# Patient Record
Sex: Female | Born: 1980 | Hispanic: No | Marital: Single | Smoking: Never smoker
Health system: Southern US, Community
[De-identification: ages and names within clinical notes are randomized; demographics above are authoritative.]

## PROBLEM LIST (undated history)

## (undated) DIAGNOSIS — K219 Gastro-esophageal reflux disease without esophagitis: Secondary | ICD-10-CM

## (undated) DIAGNOSIS — G43909 Migraine, unspecified, not intractable, without status migrainosus: Secondary | ICD-10-CM

## (undated) DIAGNOSIS — D649 Anemia, unspecified: Secondary | ICD-10-CM

## (undated) DIAGNOSIS — I10 Essential (primary) hypertension: Secondary | ICD-10-CM

## (undated) DIAGNOSIS — O009 Unspecified ectopic pregnancy without intrauterine pregnancy: Secondary | ICD-10-CM

## (undated) DIAGNOSIS — Z5189 Encounter for other specified aftercare: Secondary | ICD-10-CM

## (undated) HISTORY — DX: Encounter for other specified aftercare: Z51.89

## (undated) HISTORY — PX: DILATION AND CURETTAGE OF UTERUS: SHX78

## (undated) HISTORY — DX: Essential (primary) hypertension: I10

---

## 1999-07-18 HISTORY — PX: TUBAL LIGATION: SHX77

## 2003-04-01 ENCOUNTER — Other Ambulatory Visit: Admission: RE | Admit: 2003-04-01 | Discharge: 2003-04-01 | Payer: Self-pay | Admitting: Obstetrics & Gynecology

## 2003-04-21 ENCOUNTER — Ambulatory Visit (HOSPITAL_COMMUNITY): Admission: RE | Admit: 2003-04-21 | Discharge: 2003-04-21 | Payer: Self-pay | Admitting: Obstetrics & Gynecology

## 2005-01-04 ENCOUNTER — Ambulatory Visit: Payer: Self-pay | Admitting: Family Medicine

## 2005-01-10 ENCOUNTER — Ambulatory Visit: Payer: Self-pay | Admitting: Family Medicine

## 2005-01-11 ENCOUNTER — Ambulatory Visit (HOSPITAL_COMMUNITY): Admission: RE | Admit: 2005-01-11 | Discharge: 2005-01-11 | Payer: Self-pay | Admitting: Family Medicine

## 2005-01-19 ENCOUNTER — Ambulatory Visit: Payer: Self-pay | Admitting: Family Medicine

## 2005-02-03 ENCOUNTER — Ambulatory Visit: Payer: Self-pay | Admitting: Family Medicine

## 2005-02-17 ENCOUNTER — Ambulatory Visit: Payer: Self-pay | Admitting: Family Medicine

## 2005-04-19 ENCOUNTER — Ambulatory Visit: Payer: Self-pay | Admitting: Family Medicine

## 2005-05-19 ENCOUNTER — Ambulatory Visit: Payer: Self-pay | Admitting: Family Medicine

## 2006-05-28 ENCOUNTER — Emergency Department (HOSPITAL_COMMUNITY): Admission: EM | Admit: 2006-05-28 | Discharge: 2006-05-28 | Payer: Self-pay | Admitting: Emergency Medicine

## 2006-09-11 ENCOUNTER — Ambulatory Visit: Payer: Self-pay | Admitting: Family Medicine

## 2006-09-25 ENCOUNTER — Ambulatory Visit: Payer: Self-pay | Admitting: Family Medicine

## 2006-12-17 ENCOUNTER — Ambulatory Visit: Payer: Self-pay | Admitting: Family Medicine

## 2007-03-13 ENCOUNTER — Ambulatory Visit (HOSPITAL_COMMUNITY): Admission: RE | Admit: 2007-03-13 | Discharge: 2007-03-13 | Payer: Self-pay | Admitting: Obstetrics & Gynecology

## 2008-04-12 ENCOUNTER — Emergency Department (HOSPITAL_COMMUNITY): Admission: EM | Admit: 2008-04-12 | Discharge: 2008-04-12 | Payer: Self-pay | Admitting: Emergency Medicine

## 2008-05-08 ENCOUNTER — Emergency Department (HOSPITAL_COMMUNITY): Admission: EM | Admit: 2008-05-08 | Discharge: 2008-05-08 | Payer: Self-pay | Admitting: Emergency Medicine

## 2009-12-22 ENCOUNTER — Emergency Department (HOSPITAL_COMMUNITY): Admission: EM | Admit: 2009-12-22 | Discharge: 2009-12-22 | Payer: Self-pay | Admitting: Emergency Medicine

## 2010-02-22 ENCOUNTER — Other Ambulatory Visit: Admission: RE | Admit: 2010-02-22 | Discharge: 2010-02-22 | Payer: Self-pay | Admitting: Obstetrics & Gynecology

## 2010-11-29 NOTE — Op Note (Signed)
Jaclyn Thornton, SIGNOR                ACCOUNT NO.:  [id number]   MEDICAL RECORD NO.:  [id number]          PATIENT TYPE:  AMB   LOCATION:  DAY                           FACILITY:  [facility]   PHYSICIAN:  Lazaro Arms, M.D.   DATE OF BIRTH:  Apr 17, 1981   DATE OF PROCEDURE:  03/13/2007  DATE OF DISCHARGE:                               OPERATIVE REPORT   PREOPERATIVE DIAGNOSIS:  Desired sterilization.   POSTOPERATIVE DIAGNOSIS:  Desired sterilization.   PROCEDURE:  Laparoscopic tubal ligation.   SURGEON:  Lazaro Arms, M.D.   ANESTHESIA:  General endotracheal anesthesia.   FINDINGS:  The patient had a normal uterus, tubes and ovaries.  There  were no abnormalities of the pelvis noted.   DESCRIPTION OF PROCEDURE:  The patient was taken to the operating room  and placed in the supine position and underwent general endotracheal  anesthesia.  She was placed in the dorsal lithotomy position and was  prepped and draped in the usual sterile fashion.  A Hulka tenaculum was  placed in the uterus for uterine manipulation.  An incision was made in  the umbilicus.  A Veress needle was used.  The peritoneal cavity was  insufflated.  A non-bladed 11 mm trocar was used with the video  laparoscope and direct peritoneal placement was viewed under direct  visualization.  The fallopian tubes were identified bilaterally.  The  patient was placed in the Trendelenburg position.  They were burned, no  resistance beyond the distal isthmic ampullary region of the tube, an  approximately 2.5 cm segment bilaterally.  There was good hemostasis.  The gas was allowed to escape.  The instruments were removed from the  vagina.  The fascia was closed using #0 Vicryl.  The subcutaneous tissue  was closed using #0 Vicryl as well and the skin was closed using skin  staples.   The patient tolerated the procedure well.  She experienced minimal blood  loss.  She was taken to the recovery room in good stable condition.   All  counts were correct x3.      Lazaro Arms, M.D.  Electronically Signed     LHE/MEDQ  D:  03/13/2007  T:  03/13/2007  Job:  [id number]

## 2010-12-02 NOTE — Op Note (Signed)
   NAME:  Jaclyn Thornton, Jaclyn Thornton                          ACCOUNT NO.:  [id number]   MEDICAL RECORD NO.:  [id number]                   PATIENT TYPE:  AMB   LOCATION:  DAY                                  FACILITY:  [facility]   PHYSICIAN:  Lazaro Arms, M.D.                DATE OF BIRTH:  09/24/1980   DATE OF PROCEDURE:  04/21/2003  DATE OF DISCHARGE:  04/21/2003                                 OPERATIVE REPORT   PREOPERATIVE DIAGNOSIS:  Moderate cervical dysplasia with large lesion.   POSTOPERATIVE DIAGNOSIS:  Moderate cervical dysplasia with large lesion.   PROCEDURE:  Laser ablation of the cervix.   SURGEON:  Lazaro Arms, M.D.   ANESTHESIA:  Laryngeal mask airway.   FINDINGS:  The patient was seen in the office after an abnormal Pap smear.  She underwent a colposcopic-directed biopsy and was found to have moderate  cervical dysplasia with biopsy with a relatively large lesion.  As a result,  it was recommended that she undergo laser ablation of the cervix.   DESCRIPTION OF PROCEDURE:  The patient was taken to the operating room and  placed in the supine position where she underwent laryngeal mask airway.  She then was placed in the dorsal lithotomy position and draped in the usual  sterile fashion.   A pericervical block using 0.5% Marcaine with 1:200,000 epinephrine was  placed.  The holmium laser was used, and an ablation of 5 to 7 mm  peripherally to 7 to 9 mm centrally was performed, getting good margin  around all the dysplasia.  The entire transformation zone was treated.   The patient was awakened from anesthesia and taken to the recovery room in  good and stable condition.  There was no blood loss for the procedure.      ___________________________________________                                            Lazaro Arms, M.D.   LHE/MEDQ  D:  05/14/2003  T:  05/14/2003  Job:  [id number]

## 2011-04-28 LAB — COMPREHENSIVE METABOLIC PANEL
ALT: 18
Albumin: 3.7
Alkaline Phosphatase: 50
BUN: 7
GFR calc Af Amer: >60
Sodium: 136

## 2011-04-28 LAB — CBC
HCT: 35.6 — ABNORMAL LOW
MCV: 85.8
Platelets: 304

## 2011-04-28 LAB — URINALYSIS, ROUTINE W REFLEX MICROSCOPIC
Bilirubin Urine: NEGATIVE
Ketones, ur: NEGATIVE
Nitrite: NEGATIVE
Specific Gravity, Urine: 1.02
Urobilinogen, UA: 0.2

## 2011-04-28 LAB — DIFFERENTIAL
Basophils Absolute: 0
Eosinophils Absolute: 0.1
Lymphocytes Relative: 25
Lymphs Abs: 2.1
Monocytes Relative: 6
Neutro Abs: 5.7

## 2011-04-28 LAB — HCG, QUANTITATIVE, PREGNANCY: hCG, Beta Chain, Quant, S: <2

## 2011-06-02 IMAGING — CR DG LUMBAR SPINE COMPLETE 4+V
5 series · 5 of 5 positions shown · non-contrast
Comparison: None.

CLINICAL DATA: History of injury with back pain.  Right hip pain.

LUMBAR SPINE - COMPLETE 4+ VIEW

[view not recorded (1 of 5)]
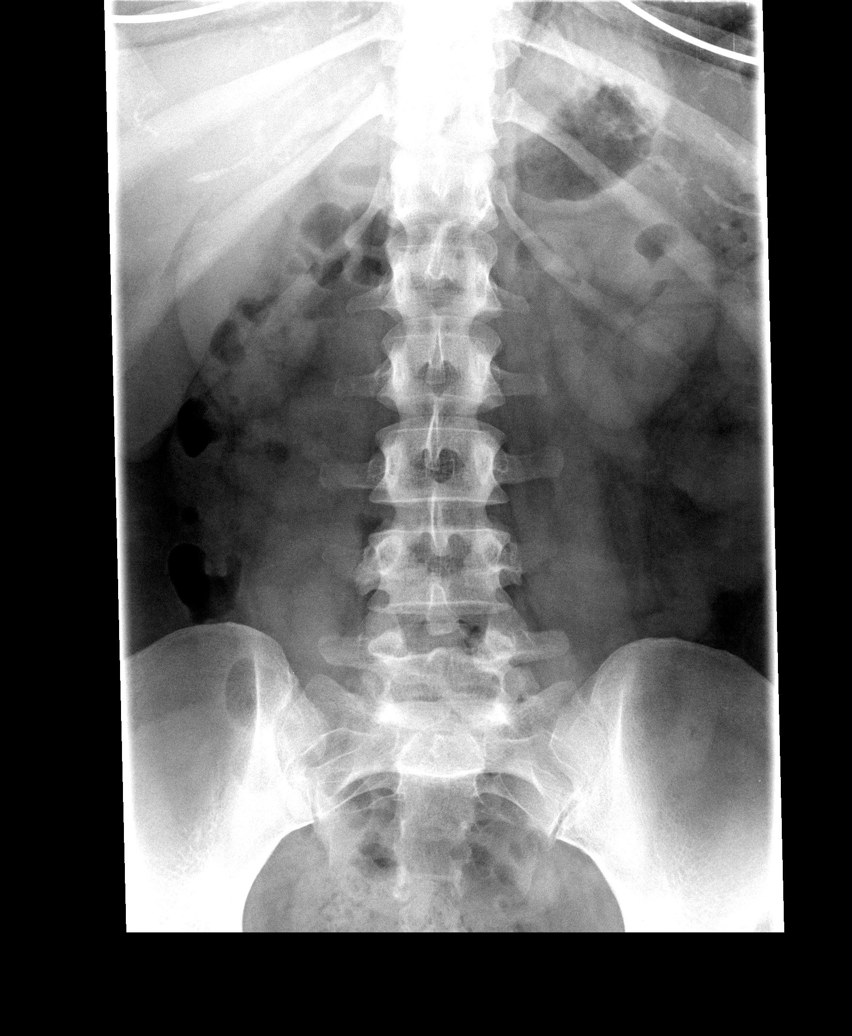

[view not recorded (2 of 5)]
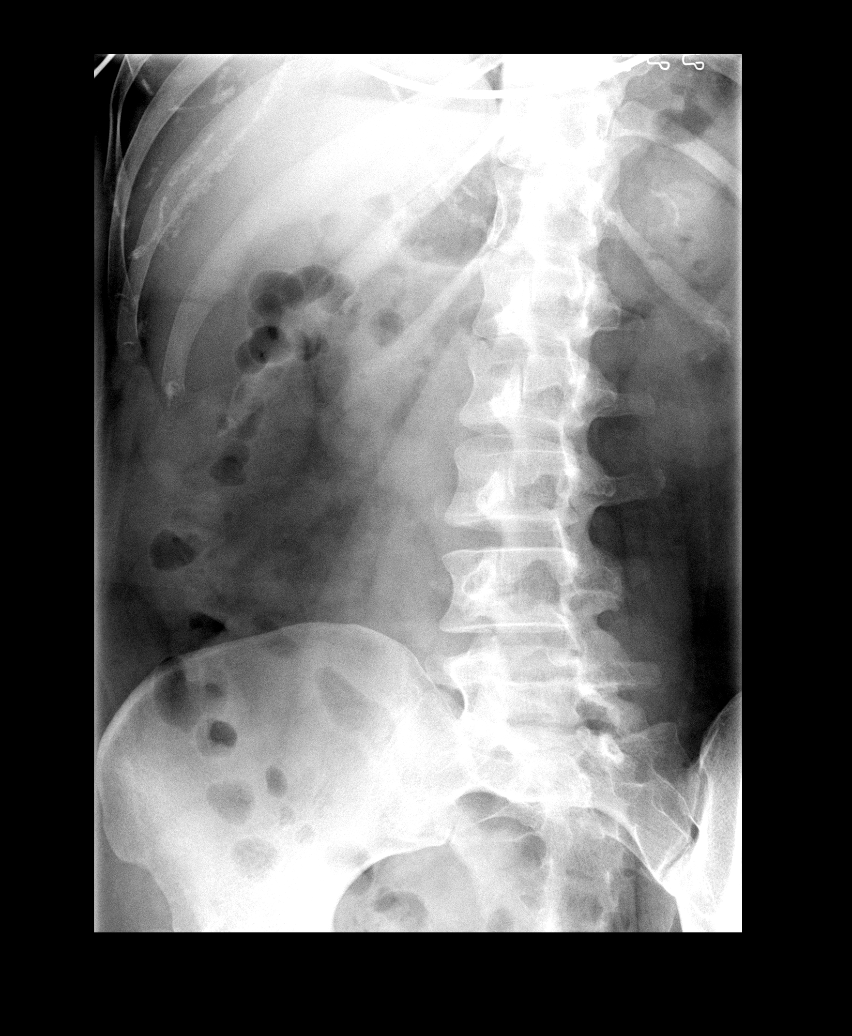

[view not recorded (3 of 5)]
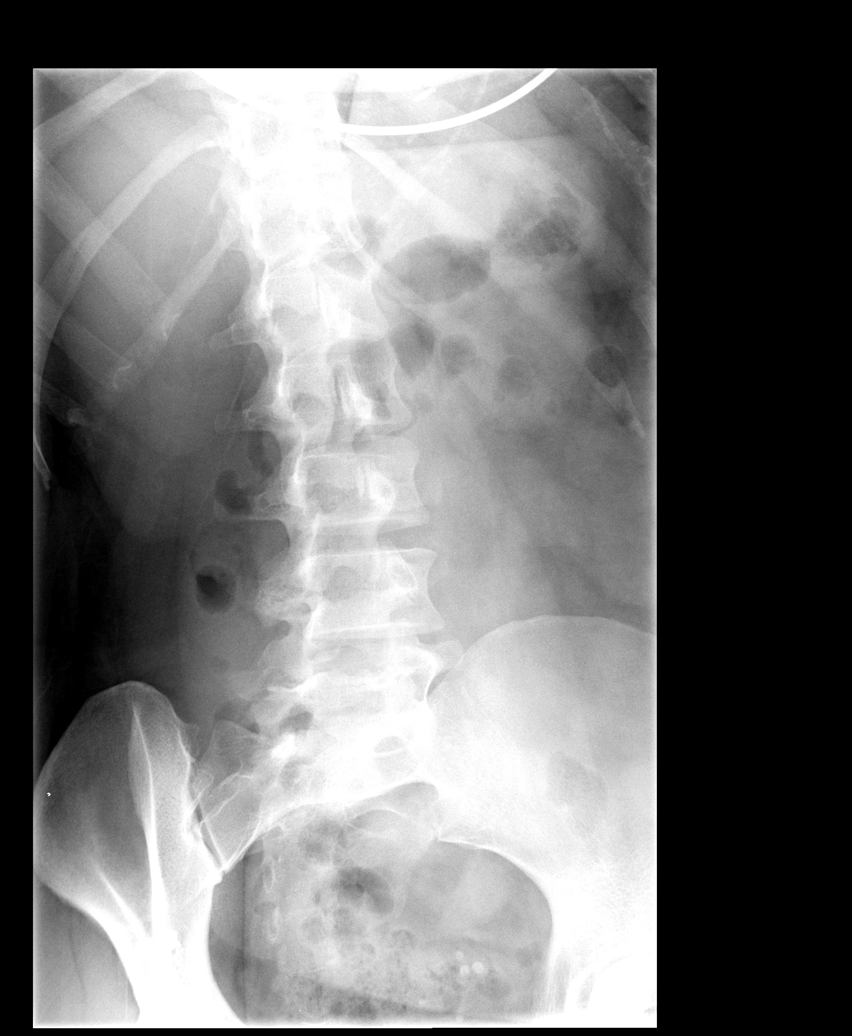

[view not recorded (4 of 5)]
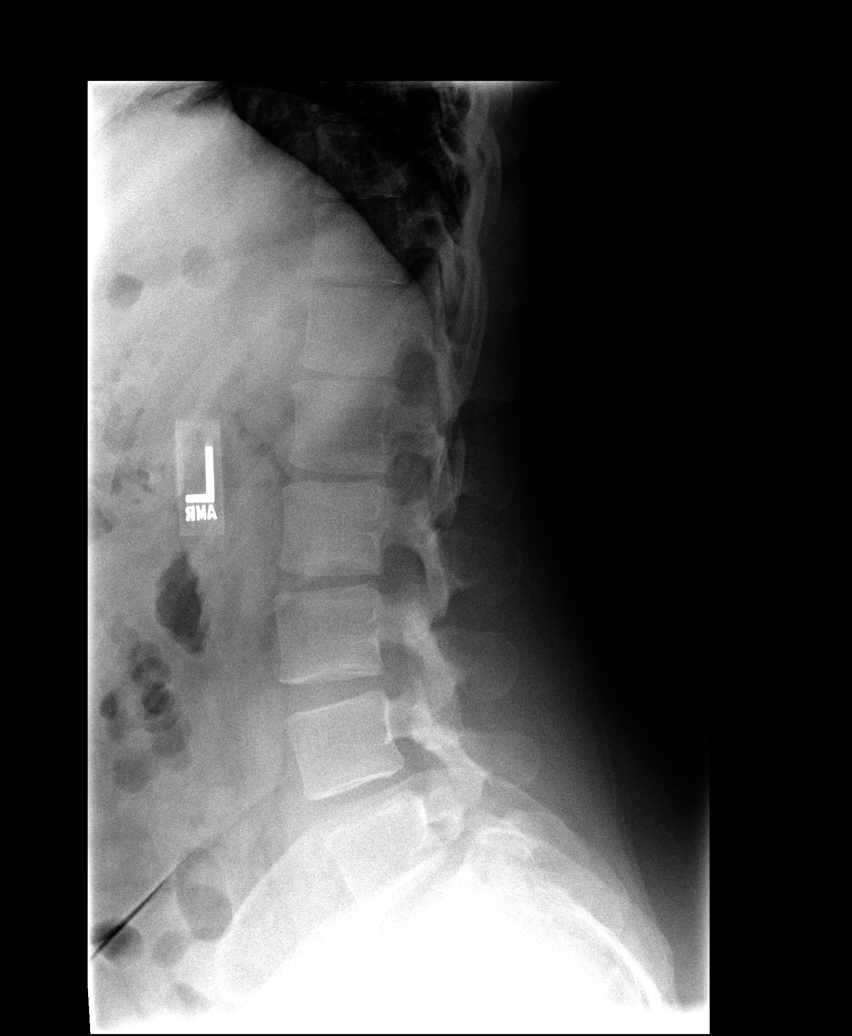

[view not recorded (5 of 5)]
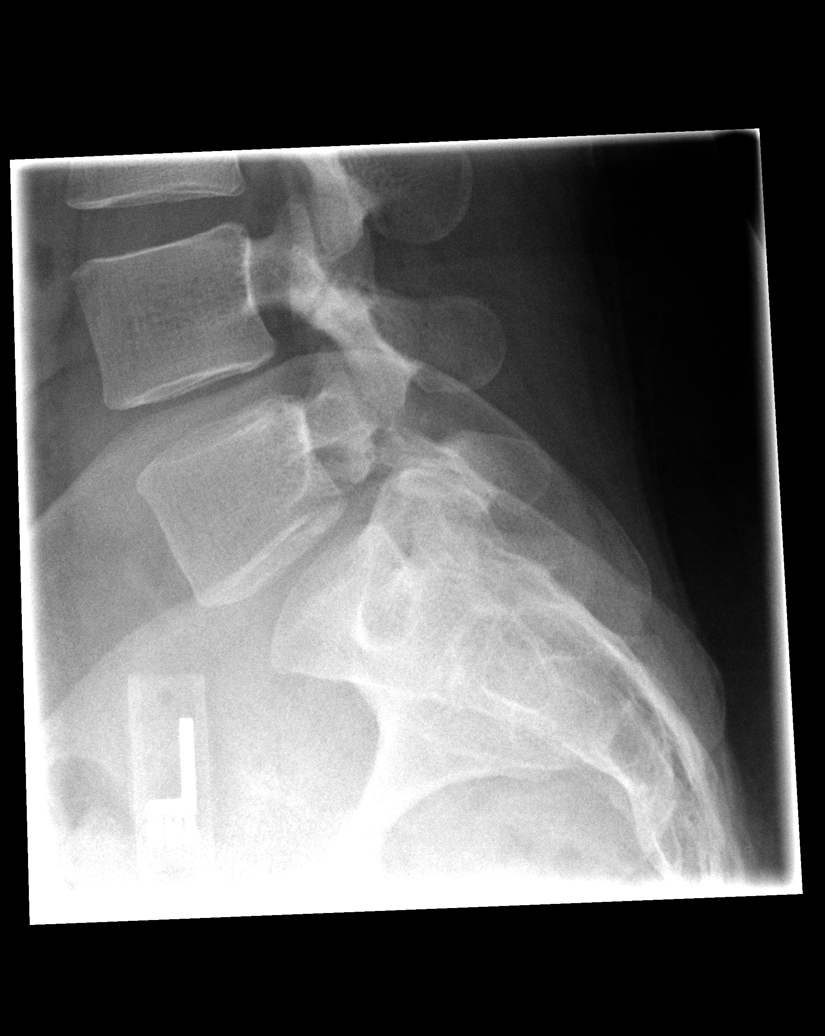

[5 of 5 positions shown; findings below may reference images not displayed]

FINDINGS: SI joints appear intact.  Alignment is normal.
Intervertebral disc spaces are preserved.  No fractures subluxation
is seen.  There is suggestion of possible pars defects on lateral
image oblique views no definite pars defects are seen.  No
significant spondylosis is seen.
IMPRESSION: No fracture or subluxation is evident.

## 2014-07-17 DIAGNOSIS — O009 Unspecified ectopic pregnancy without intrauterine pregnancy: Secondary | ICD-10-CM

## 2014-07-17 HISTORY — DX: Unspecified ectopic pregnancy without intrauterine pregnancy: O00.90

## 2015-07-07 ENCOUNTER — Emergency Department (HOSPITAL_COMMUNITY): Payer: Medicaid Other

## 2015-07-07 ENCOUNTER — Encounter (HOSPITAL_COMMUNITY): Payer: Self-pay

## 2015-07-07 ENCOUNTER — Emergency Department (HOSPITAL_COMMUNITY)
Admission: EM | Admit: 2015-07-07 | Discharge: 2015-07-07 | Disposition: A | Discharge status: Home / Self Care | Payer: Medicaid Other | Attending: Emergency Medicine | Admitting: Emergency Medicine

## 2015-07-07 DIAGNOSIS — O9989 Other specified diseases and conditions complicating pregnancy, childbirth and the puerperium: Secondary | ICD-10-CM | POA: Insufficient documentation

## 2015-07-07 DIAGNOSIS — R0602 Shortness of breath: Secondary | ICD-10-CM | POA: Diagnosis not present

## 2015-07-07 DIAGNOSIS — Z862 Personal history of diseases of the blood and blood-forming organs and certain disorders involving the immune mechanism: Secondary | ICD-10-CM | POA: Insufficient documentation

## 2015-07-07 DIAGNOSIS — R109 Unspecified abdominal pain: Secondary | ICD-10-CM | POA: Diagnosis not present

## 2015-07-07 DIAGNOSIS — R51 Headache: Secondary | ICD-10-CM | POA: Diagnosis not present

## 2015-07-07 DIAGNOSIS — D649 Anemia, unspecified: Secondary | ICD-10-CM

## 2015-07-07 DIAGNOSIS — Z9851 Tubal ligation status: Secondary | ICD-10-CM | POA: Insufficient documentation

## 2015-07-07 DIAGNOSIS — Z3A01 Less than 8 weeks gestation of pregnancy: Secondary | ICD-10-CM | POA: Insufficient documentation

## 2015-07-07 DIAGNOSIS — O200 Threatened abortion: Secondary | ICD-10-CM | POA: Diagnosis not present

## 2015-07-07 DIAGNOSIS — O209 Hemorrhage in early pregnancy, unspecified: Secondary | ICD-10-CM | POA: Diagnosis present

## 2015-07-07 DIAGNOSIS — Z3490 Encounter for supervision of normal pregnancy, unspecified, unspecified trimester: Secondary | ICD-10-CM

## 2015-07-07 DIAGNOSIS — O99511 Diseases of the respiratory system complicating pregnancy, first trimester: Secondary | ICD-10-CM | POA: Diagnosis not present

## 2015-07-07 HISTORY — DX: Migraine, unspecified, not intractable, without status migrainosus: G43.909

## 2015-07-07 LAB — BASIC METABOLIC PANEL
ANION GAP: 7 (ref 5–15)
BUN: 8 mg/dL (ref 6–20)
CALCIUM: 8.8 mg/dL — AB (ref 8.9–10.3)
CO2: 22 mmol/L (ref 22–32)
Chloride: 107 mmol/L (ref 101–111)
Creatinine, Ser: 0.51 mg/dL (ref 0.44–1.00)
Glucose, Bld: 98 mg/dL (ref 65–99)
Potassium: 3.8 mmol/L (ref 3.5–5.1)
SODIUM: 136 mmol/L (ref 135–145)

## 2015-07-07 LAB — URINALYSIS, ROUTINE W REFLEX MICROSCOPIC
Bilirubin Urine: NEGATIVE
GLUCOSE, UA: NEGATIVE mg/dL
Hgb urine dipstick: NEGATIVE
Ketones, ur: NEGATIVE mg/dL
Leukocytes, UA: NEGATIVE
Nitrite: NEGATIVE
PROTEIN: NEGATIVE mg/dL
Specific Gravity, Urine: 1.025 (ref 1.005–1.030)
pH: 6 (ref 5.0–8.0)

## 2015-07-07 LAB — HCG, QUANTITATIVE, PREGNANCY: HCG, BETA CHAIN, QUANT, S: 2025 m[IU]/mL — AB (ref <5)

## 2015-07-07 LAB — CBC WITH DIFFERENTIAL/PLATELET
BASOS ABS: 0 10*3/uL (ref 0.0–0.1)
BASOS PCT: 0 %
EOS ABS: 0.1 10*3/uL (ref 0.0–0.7)
Eosinophils Relative: 1 %
HEMATOCRIT: 22.8 % — AB (ref 36.0–46.0)
HEMOGLOBIN: 6.8 g/dL — AB (ref 12.0–15.0)
Lymphocytes Relative: 19 %
Lymphs Abs: 1.6 10*3/uL (ref 0.7–4.0)
MCH: 23.7 pg — ABNORMAL LOW (ref 26.0–34.0)
MCHC: 29.8 g/dL — ABNORMAL LOW (ref 30.0–36.0)
MCV: 79.4 fL (ref 78.0–100.0)
Monocytes Absolute: 0.7 10*3/uL (ref 0.1–1.0)
Monocytes Relative: 8 %
NEUTROS ABS: 6.3 10*3/uL (ref 1.7–7.7)
NEUTROS PCT: 73 %
Platelets: 508 10*3/uL — ABNORMAL HIGH (ref 150–400)
RBC: 2.87 MIL/uL — AB (ref 3.87–5.11)
RDW: 20.3 % — ABNORMAL HIGH (ref 11.5–15.5)
WBC: 8.6 10*3/uL (ref 4.0–10.5)

## 2015-07-07 LAB — POC URINE PREG, ED: Preg Test, Ur: POSITIVE — AB

## 2015-07-07 LAB — ABO/RH: ABO/RH(D): A POS

## 2015-07-07 LAB — I-STAT BETA HCG BLOOD, ED (MC, WL, AP ONLY): HCG, QUANTITATIVE: 1794.3 m[IU]/mL — AB (ref <5)

## 2015-07-07 LAB — PREPARE RBC (CROSSMATCH)

## 2015-07-07 MED ORDER — SODIUM CHLORIDE 0.9 % IV SOLN: 10.0000 mL/h | Freq: Once | INTRAVENOUS | Status: DC

## 2015-07-07 MED ORDER — SODIUM CHLORIDE 0.9 % IV BOLUS (SEPSIS)
500.0000 mL | Freq: Once | INTRAVENOUS | Status: AC
  Administered 2015-07-07: 500 mL via INTRAVENOUS

## 2015-07-07 MED ORDER — SODIUM CHLORIDE 0.9 % IV SOLN
INTRAVENOUS | Status: DC
  Administered 2015-07-07: 10:00:00 via INTRAVENOUS

## 2015-07-07 NOTE — ED Notes (Signed)
CRITICAL VALUE ALERT  Critical value received:  Hgb 6.8  Date of notification:  07/07/2015  Time of notification: 1030  Critical value read back: Yes  Nurse who received alert:  Merleen Millinerhristina Bently Morath, RN   MD notified (1st page):  MD Deretha EmoryZackowski

## 2015-07-07 NOTE — ED Notes (Signed)
Spoke to [facility], blood bank. Sherry informed nurse that second unit of blood could be withheld and used for another pt and pt will not be charged.  Dr. Deretha EmoryZackowski notified.

## 2015-07-07 NOTE — ED Notes (Signed)
Pt reports left sided abd pain and positive pregnancy test for past few days.  Reports had tubal ligation 10 years ago.

## 2015-07-07 NOTE — ED Provider Notes (Signed)
CSN: [id number]     Arrival date & time 07/07/15  9562 History  By signing my name below, I, Placido Sou, attest that this documentation has been prepared under the direction and in the presence of Vanetta Mulders, MD. Electronically Signed: Placido Sou, ED Scribe. 07/07/2015. 11:03 AM.   Chief Complaint  Patient presents with  . Possible Ectopic Pregnancy    The history is provided by the patient. No language interpreter was used.    HPI Comments: Jaclyn Thornton is a 34 y.o. female with a SHx including tubal ligation 10 years ago who presents to the Emergency Department complaining of constant, mild, left sided abd pain onset a few days ago. Pt notes taking a pregnancy test 3 days ago which came back positive. She describes her pain as cramping. She notes some mild SOB that worsens with exertion and an intermittent, mild, HA. She notes her LNMP was 11/9. She notes a hx of anemia as a child and took an iron supplement for some time. Pt notes having 2 boys who are 12 and 36 years old further noting this would be her third pregnancy. She denies vaginal bleeding, lightheadedness, fever, chills, visual disturbances, cold like symptoms, CP, n/v/d, dysuria, hematuria, bloody stool, leg swelling, rashes, bleeding easily, back pain and LOC.   Past Medical History  Diagnosis Date  . Migraines    Past Surgical History  Procedure Laterality Date  . Tubal ligation     No family history on file. Social History  Substance Use Topics  . Smoking status: Never Smoker   . Smokeless tobacco: None  . Alcohol Use: Yes     Comment: occ   OB History    No data available     Review of Systems  Constitutional: Negative for fever and chills.  HENT: Negative for congestion, postnasal drip, rhinorrhea and sore throat.   Eyes: Negative for visual disturbance.  Respiratory: Positive for shortness of breath. Negative for cough.   Cardiovascular: Negative for chest pain and leg swelling.   Gastrointestinal: Positive for abdominal pain. Negative for nausea, vomiting, diarrhea and blood in stool.  Genitourinary: Negative for dysuria, hematuria and vaginal bleeding.  Musculoskeletal: Negative for back pain.  Skin: Negative for rash.  Neurological: Positive for headaches. Negative for syncope and light-headedness.  Hematological: Does not bruise/bleed easily.  Psychiatric/Behavioral: Negative for confusion.  All other systems reviewed and are negative.  Allergies  Review of patient's allergies indicates no known allergies.  Home Medications   Prior to Admission medications   Medication Sig Start Date End Date Taking? Authorizing Provider  acetaminophen (TYLENOL) 500 MG tablet Take 1,000 mg by mouth every 6 (six) hours as needed for headache.   Yes Historical Provider, MD  Aspirin-Salicylamide-Caffeine (BC HEADACHE POWDER PO) Take 1 packet by mouth daily as needed (headache).   Yes Historical Provider, MD   BP 126/79 mmHg  Pulse 89  Temp(Src) 98.1 F (36.7 C) (Oral)  Resp 22  Ht  (1.676 m)  Wt 84.369 kg  BMI 30.04 kg/m2  SpO2 100%  LMP 05/26/2015 Physical Exam  Constitutional: She is oriented to person, place, and time. She appears well-developed and well-nourished.  HENT:  Head: Normocephalic and atraumatic.  Mouth/Throat: No oropharyngeal exudate.  Eyes: EOM are normal. Pupils are equal, round, and reactive to light. No scleral icterus.  Neck: Normal range of motion. No tracheal deviation present.  Cardiovascular: Normal rate, regular rhythm and normal heart sounds.   No murmur heard. Pulmonary/Chest: Effort normal  and breath sounds normal. No respiratory distress. She has no wheezes. She has no rales. She exhibits no tenderness.  Abdominal: Soft. Bowel sounds are normal. There is no tenderness.  Musculoskeletal: Normal range of motion. She exhibits no edema.  No swelling of bilateral ankles; cap refill less than 1 second  Neurological: She is alert and  oriented to person, place, and time. No cranial nerve deficit. She exhibits normal muscle tone. Coordination normal.  Skin: Skin is warm and dry. She is not diaphoretic.  Psychiatric: She has a normal mood and affect. Her behavior is normal.  Nursing note and vitals reviewed.  ED Course  Procedures  DIAGNOSTIC STUDIES: Oxygen Saturation is 100% on RA, normal by my interpretation.    COORDINATION OF CARE: 10:51 AM Discussed next steps with pt and she agreed to the plan.   Labs Review Labs Reviewed  BASIC METABOLIC PANEL - Abnormal; Notable for the following:    Calcium 8.8 (*)    All other components within normal limits  CBC WITH DIFFERENTIAL/PLATELET - Abnormal; Notable for the following:    RBC 2.87 (*)    Hemoglobin 6.8 (*)    HCT 22.8 (*)    MCH 23.7 (*)    MCHC 29.8 (*)    RDW 20.3 (*)    Platelets 508 (*)    All other components within normal limits  HCG, QUANTITATIVE, PREGNANCY - Abnormal; Notable for the following:    hCG, Beta Chain, Quant, S 2025 (*)    All other components within normal limits  POC URINE PREG, ED - Abnormal; Notable for the following:    Preg Test, Ur POSITIVE (*)    All other components within normal limits  I-STAT BETA HCG BLOOD, ED ([facility], [facility], [facility] ONLY) - Abnormal; Notable for the following:    I-stat hCG, quantitative 1794.3 (*)    All other components within normal limits  URINALYSIS, ROUTINE W REFLEX MICROSCOPIC (NOT AT [facility])  PREPARE RBC (CROSSMATCH)  TYPE AND SCREEN  ABO/RH   Results for orders placed or performed during the hospital encounter of 07/07/15  Urinalysis, Routine w reflex microscopic (not at [facility])  Result Value Ref Range   Color, Urine YELLOW YELLOW   APPearance CLEAR CLEAR   Specific Gravity, Urine 1.025 1.005 - 1.030   pH 6.0 5.0 - 8.0   Glucose, UA NEGATIVE NEGATIVE mg/dL   Hgb urine dipstick NEGATIVE NEGATIVE   Bilirubin Urine NEGATIVE NEGATIVE   Ketones, ur NEGATIVE NEGATIVE mg/dL   Protein, ur NEGATIVE NEGATIVE  mg/dL   Nitrite NEGATIVE NEGATIVE   Leukocytes, UA NEGATIVE NEGATIVE  Basic metabolic panel  Result Value Ref Range   Sodium 136 135 - 145 mmol/L   Potassium 3.8 3.5 - 5.1 mmol/L   Chloride 107 101 - 111 mmol/L   CO2 22 22 - 32 mmol/L   Glucose, Bld 98 65 - 99 mg/dL   BUN 8 6 - 20 mg/dL   Creatinine, Ser 9.60 0.44 - 1.00 mg/dL   Calcium 8.8 (L) 8.9 - 10.3 mg/dL   GFR calc non Af Amer >60 >60 mL/min   GFR calc Af Amer >60 >60 mL/min   Anion gap 7 5 - 15  CBC with Differential/Platelet  Result Value Ref Range   WBC 8.6 4.0 - 10.5 K/uL   RBC 2.87 (L) 3.87 - 5.11 MIL/uL   Hemoglobin 6.8 (LL) 12.0 - 15.0 g/dL   HCT 45.4 (L) 09.8 - 11.9 %   MCV 79.4 78.0 - 100.0 fL  MCH 23.7 (L) 26.0 - 34.0 pg   MCHC 29.8 (L) 30.0 - 36.0 g/dL   RDW 82.920.3 (H) 56.211.5 - 13.015.5 %   Platelets 508 (H) 150 - 400 K/uL   Neutrophils Relative % 73 %   Neutro Abs 6.3 1.7 - 7.7 K/uL   Lymphocytes Relative 19 %   Lymphs Abs 1.6 0.7 - 4.0 K/uL   Monocytes Relative 8 %   Monocytes Absolute 0.7 0.1 - 1.0 K/uL   Eosinophils Relative 1 %   Eosinophils Absolute 0.1 0.0 - 0.7 K/uL   Basophils Relative 0 %   Basophils Absolute 0.0 0.0 - 0.1 K/uL  hCG, quantitative, pregnancy  Result Value Ref Range   hCG, Beta Chain, Quant, S 2025 (H) <5 mIU/mL  POC Urine Pregnancy, ED (do NOT order at [facility])  Result Value Ref Range   Preg Test, Ur POSITIVE (A) NEGATIVE  I-Stat Beta hCG blood, ED ([facility], [facility], [facility] only)  Result Value Ref Range   I-stat hCG, quantitative 1794.3 (H) <5 mIU/mL   Comment 3          Prepare RBC  Result Value Ref Range   Order Confirmation ORDER PROCESSED BY BLOOD BANK   Type and screen [facility]  Result Value Ref Range   ABO/RH(D) A POS    Antibody Screen NEG    Sample Expiration 07/10/2015    Unit Number [id number]    Blood Component Type RED CELLS,LR    Unit division 00    Status of Unit ALLOCATED    Transfusion Status OK TO TRANSFUSE    Crossmatch Result Compatible    Unit Number  [id number]    Blood Component Type RED CELLS,LR    Unit division 00    Status of Unit ISSUED    Transfusion Status OK TO TRANSFUSE    Crossmatch Result Compatible   ABO/Rh  Result Value Ref Range   ABO/RH(D) A POS      Imaging Review [place] Ob Comp Less 14 Wks  07/07/2015  CLINICAL DATA:  Pelvic pain for 1 week EXAM: OBSTETRIC <14 WK US AND TRANSVAGINAL OB US TECHNIQUE: Both transabdominal and transvaginal ultrasound examinations were performed for complete evaluation of the gestation as well as the maternal uterus, adnexal regions, and pelvic cul-de-sac. Transvaginal technique was performed to assess early pregnancy. COMPARISON:  None. FINDINGS: Intrauterine gestational sac: No gestational sac is identified. Maternal uterus/adnexae: The uterus is within normal limits. A complex cystic lesion is noted in the left adnexa. The right ovary is within normal limits. A minimal amount of free pelvic fluid is seen. IMPRESSION: No definitive intrauterine or extrauterine gestational sac is seen. Complex cystic lesion likely representing hemorrhagic cysts in the left ovary. Correlation with serial beta HCG level is recommended. Followup examination is recommended as clinically indicated. Electronically Signed   By: Alcide CleverMark  Lukens M.D.   On: 07/07/2015 11:35   [place] Ob Transvaginal  07/07/2015  CLINICAL DATA:  Pelvic pain for 1 week EXAM: OBSTETRIC <14 WK US AND TRANSVAGINAL OB US TECHNIQUE: Both transabdominal and transvaginal ultrasound examinations were performed for complete evaluation of the gestation as well as the maternal uterus, adnexal regions, and pelvic cul-de-sac. Transvaginal technique was performed to assess early pregnancy. COMPARISON:  None. FINDINGS: Intrauterine gestational sac: No gestational sac is identified. Maternal uterus/adnexae: The uterus is within normal limits. A complex cystic lesion is noted in the left adnexa. The right ovary is within normal limits. A minimal amount of free pelvic  fluid is  seen. IMPRESSION: No definitive intrauterine or extrauterine gestational sac is seen. Complex cystic lesion likely representing hemorrhagic cysts in the left ovary. Correlation with serial beta HCG level is recommended. Followup examination is recommended as clinically indicated. Electronically Signed   By: Alcide Clever M.D.   On: 07/07/2015 11:35   I have personally reviewed and evaluated these lab results as part of my medical decision-making.   EKG Interpretation None      MDM   Final diagnoses:  Pregnancy  Threatened miscarriage  Anemia, unspecified anemia type   Patient with positive pregnancy test. Patient's had tubal ligation. Last measured. Was November 9. This would be gravida 3 para 2 for her. Initial concern due to the pelvic crampy pain and the Thornton hemoglobin was that she had a ruptured ectopic in with bleeding in her abdomen. However ultrasound shows no evidence of significant fluid. Does show evidence of a cystic complex lesion in the left adnexa. Her quantitative hCG was 2025. Patient started to receive one unit of blood transfusion out of concern for intra-abdominal hemorrhage. She went on to receive that. Patient's anemia is probably chronic. Patient was told she had anemia in the past but hasn't followed up. Patient did received one unit. The second unit of blood was canceled. Discussed with OB/GYN doctor Eure, and he will follow her up in the office on Friday. Patient given precautions return for any new or worse symptoms. Patient's symptoms could be consistent with threatened miscarriage however since she's had tubal ligation high probability of concern for ectopic pregnancy. Patient currently stable. Patient not the lightheaded no syncope no vital sign abnormalities. No vaginal bleeding.  I personally performed the services described in this documentation, which was scribed in my presence. The recorded information has been reviewed and is accurate.       Vanetta Mulders, MD 07/07/15 [phone]

## 2015-07-07 NOTE — Discharge Instructions (Signed)
Call Dr. Forestine ChuteEure's office for follow-up on Friday. Return for any worse abdominal pain vaginal bleeding or feeling like you pass out. As we discussed the fact that she had a tubal ligation and you're pregnant very concerning for an ectopic pregnancy. Also you have the pre-existing anemia there is no evidence of any blood in the abdomen. But return for any new or worse symptoms. Otherwise [facility] will see on Friday call for specific time.

## 2015-07-07 NOTE — ED Notes (Signed)
Blood consent at bedside.

## 2015-07-07 NOTE — ED Notes (Signed)
Pt made aware to return if symptoms worsen or if any life threatening symptoms occur.   

## 2015-07-08 ENCOUNTER — Encounter (HOSPITAL_COMMUNITY): Payer: Self-pay | Admitting: *Deleted

## 2015-07-08 ENCOUNTER — Emergency Department (HOSPITAL_COMMUNITY)
Admission: EM | Admit: 2015-07-08 | Discharge: 2015-07-08 | Disposition: A | Discharge status: Home / Self Care | Payer: Medicaid Other | Attending: Emergency Medicine | Admitting: Emergency Medicine

## 2015-07-08 DIAGNOSIS — Z3201 Encounter for pregnancy test, result positive: Secondary | ICD-10-CM | POA: Diagnosis not present

## 2015-07-08 DIAGNOSIS — O209 Hemorrhage in early pregnancy, unspecified: Secondary | ICD-10-CM | POA: Diagnosis not present

## 2015-07-08 DIAGNOSIS — Z8679 Personal history of other diseases of the circulatory system: Secondary | ICD-10-CM | POA: Insufficient documentation

## 2015-07-08 DIAGNOSIS — N939 Abnormal uterine and vaginal bleeding, unspecified: Secondary | ICD-10-CM

## 2015-07-08 DIAGNOSIS — Z3A01 Less than 8 weeks gestation of pregnancy: Secondary | ICD-10-CM | POA: Insufficient documentation

## 2015-07-08 DIAGNOSIS — Z9851 Tubal ligation status: Secondary | ICD-10-CM | POA: Insufficient documentation

## 2015-07-08 LAB — CBC
HCT: 29.4 % — ABNORMAL LOW (ref 36.0–46.0)
Hemoglobin: 8.7 g/dL — ABNORMAL LOW (ref 12.0–15.0)
MCH: 23.3 pg — ABNORMAL LOW (ref 26.0–34.0)
MCHC: 29.6 g/dL — ABNORMAL LOW (ref 30.0–36.0)
MCV: 78.8 fL (ref 78.0–100.0)
Platelets: 593 10*3/uL — ABNORMAL HIGH (ref 150–400)
RBC: 3.73 MIL/uL — ABNORMAL LOW (ref 3.87–5.11)
RDW: 19.5 % — ABNORMAL HIGH (ref 11.5–15.5)
WBC: 11.1 10*3/uL — ABNORMAL HIGH (ref 4.0–10.5)

## 2015-07-08 NOTE — ED Provider Notes (Signed)
CSN: [id number]     Arrival date & time 07/08/15  1231 History   First MD Initiated Contact with Patient 07/08/15 1437     Chief Complaint  Patient presents with  . Vaginal Bleeding     (Consider location/radiation/quality/duration/timing/severity/associated sxs/prior Treatment) Patient is a 34 y.o. female presenting with vaginal bleeding.  Vaginal Bleeding Associated symptoms: abdominal pain   Associated symptoms: no back pain and no nausea    patient was seen in the ER yesterday for some abdominal pain. Had positive pregnancy test and ultrasound showed ovarian cyst versus adnexal mass. No intrauterine pregnancy seen, however beta was 1700. Since then she's had some continued lower abdominal pain. Now states she has passed a little bit of blood. States she has urinated 3 times and had a little blood with it. Pain is about the same. No lightheadedness or dizziness. She was transfused yesterday after a hemoglobin 6.8 but it is likely that hemoglobin was chronic. She is post follow-up with Dr. Despina HiddenEure tomorrow, but has not made an appointment yet.  Past Medical History  Diagnosis Date  . Migraines    Past Surgical History  Procedure Laterality Date  . Tubal ligation     History reviewed. No pertinent family history. Social History  Substance Use Topics  . Smoking status: Never Smoker   . Smokeless tobacco: None  . Alcohol Use: Yes     Comment: occ   OB History    No data available     Review of Systems  Constitutional: Negative for activity change.  Respiratory: Negative for shortness of breath.   Cardiovascular: Negative for chest pain and leg swelling.  Gastrointestinal: Positive for abdominal pain. Negative for nausea.  Genitourinary: Positive for vaginal bleeding. Negative for flank pain.  Musculoskeletal: Negative for back pain and neck stiffness.  Skin: Negative for rash.  Neurological: Negative for weakness, numbness and headaches.      Allergies  Review of  patient's allergies indicates no known allergies.  Home Medications   Prior to Admission medications   Medication Sig Start Date End Date Taking? Authorizing Provider  acetaminophen (TYLENOL) 500 MG tablet Take 500 mg by mouth every 6 (six) hours as needed for headache.    Yes Historical Provider, MD  Aspirin-Salicylamide-Caffeine (BC HEADACHE POWDER PO) Take 1 packet by mouth daily as needed (headache).   Yes Historical Provider, MD   BP 132/66 mmHg  Pulse 90  Temp(Src) 97.7 F (36.5 C) (Oral)  Resp 16  Ht 5\' 5"  (1.651 m)  Wt 184 lb (83.462 kg)  BMI 30.62 kg/m2  SpO2 99%  LMP 05/26/2015 Physical Exam  Constitutional: She appears well-developed.  HENT:  Head: Atraumatic.  Cardiovascular: Normal rate.   Pulmonary/Chest: Effort normal.  Abdominal: There is no tenderness.  Genitourinary:  Mild cervical bleeding. Os is closed. No cervical motion tenderness.  Musculoskeletal: She exhibits no tenderness.  Neurological: She is alert.  Skin: Skin is warm.    ED Course  Procedures (including critical care time) Labs Review Labs Reviewed  CBC - Abnormal; Notable for the following:    WBC 11.1 (*)    RBC 3.73 (*)    Hemoglobin 8.7 (*)    HCT 29.4 (*)    MCH 23.3 (*)    MCHC 29.6 (*)    RDW 19.5 (*)    Platelets 593 (*)    All other components within normal limits    Imaging Review [place] Ob Comp Less 14 Wks  07/07/2015  CLINICAL DATA:  Pelvic  pain for 1 week EXAM: OBSTETRIC <14 WK [place] AND TRANSVAGINAL OB US TECHNIQUE: Both transabdominal and transvaginal ultrasound examinations were performed for complete evaluation of the gestation as well as the maternal uterus, adnexal regions, and pelvic cul-de-sac. Transvaginal technique was performed to assess early pregnancy. COMPARISON:  None. FINDINGS: Intrauterine gestational sac: No gestational sac is identified. Maternal uterus/adnexae: The uterus is within normal limits. A complex cystic lesion is noted in the left adnexa. The right  ovary is within normal limits. A minimal amount of free pelvic fluid is seen. IMPRESSION: No definitive intrauterine or extrauterine gestational sac is seen. Complex cystic lesion likely representing hemorrhagic cysts in the left ovary. Correlation with serial beta HCG level is recommended. Followup examination is recommended as clinically indicated. Electronically Signed   By: Alcide Clever M.D.   On: 07/07/2015 11:35   US Ob Transvaginal  07/07/2015  CLINICAL DATA:  Pelvic pain for 1 week EXAM: OBSTETRIC <14 WK [place] AND TRANSVAGINAL OB US TECHNIQUE: Both transabdominal and transvaginal ultrasound examinations were performed for complete evaluation of the gestation as well as the maternal uterus, adnexal regions, and pelvic cul-de-sac. Transvaginal technique was performed to assess early pregnancy. COMPARISON:  None. FINDINGS: Intrauterine gestational sac: No gestational sac is identified. Maternal uterus/adnexae: The uterus is within normal limits. A complex cystic lesion is noted in the left adnexa. The right ovary is within normal limits. A minimal amount of free pelvic fluid is seen. IMPRESSION: No definitive intrauterine or extrauterine gestational sac is seen. Complex cystic lesion likely representing hemorrhagic cysts in the left ovary. Correlation with serial beta HCG level is recommended. Followup examination is recommended as clinically indicated. Electronically Signed   By: Alcide Clever M.D.   On: 07/07/2015 11:35   I have personally reviewed and evaluated these images and lab results as part of my medical decision-making.   EKG Interpretation None      MDM   Final diagnoses:  Vaginal bleeding    Patient with vaginal bleeding. Has possible ectopic pregnancy. Has follow-up tomorrow. Patient had slight vaginal bleeding and hemoglobin that appears to be stable. Her hemoglobin is higher than was yesterday but she was transfused. Will discharge to follow-up tomorrow as planned.    Benjiman Core, MD 07/08/15 [phone]

## 2015-07-08 NOTE — ED Notes (Signed)
Pelvic supplies ready, MD aware

## 2015-07-08 NOTE — ED Notes (Signed)
MD at the bedside  

## 2015-07-08 NOTE — Discharge Instructions (Signed)
return for severe bleeding. Increased pain. Lightheadedness or dizziness. follow-up with Dr. Despina HiddenEure tomorrow.

## 2015-07-08 NOTE — ED Notes (Signed)
Patient given discharge instruction, verbalized understand. Patient ambulatory out of the department.  

## 2015-07-08 NOTE — ED Notes (Signed)
Pt with vaginal bleeding , states seen and dx with ectopic pregnancy. Was instructed to come back if vaginal bleeding

## 2015-07-08 NOTE — ED Notes (Signed)
Pt cleaned up, and dressed,  ambulatory to the bathroom to void

## 2015-07-09 ENCOUNTER — Other Ambulatory Visit: Payer: Self-pay

## 2015-07-09 ENCOUNTER — Other Ambulatory Visit (HOSPITAL_COMMUNITY)
Admission: RE | Admit: 2015-07-09 | Discharge: 2015-07-09 | Disposition: A | Discharge status: Home / Self Care | Payer: Medicaid Other | Source: Ambulatory Visit | Attending: Obstetrics and Gynecology | Admitting: Obstetrics and Gynecology

## 2015-07-09 DIAGNOSIS — O009 Unspecified ectopic pregnancy without intrauterine pregnancy: Secondary | ICD-10-CM

## 2015-07-09 DIAGNOSIS — Z331 Pregnant state, incidental: Secondary | ICD-10-CM | POA: Insufficient documentation

## 2015-07-09 LAB — HCG, QUANTITATIVE, PREGNANCY: HCG, BETA CHAIN, QUANT, S: 2928 m[IU]/mL — AB (ref <5)

## 2015-07-09 NOTE — Addendum Note (Signed)
Addended by: Richardson ChiquitoRAVIS, ASHLEY M on: 07/09/2015 09:51 AM   Modules accepted: Orders

## 2015-07-10 LAB — TYPE AND SCREEN
ABO/RH(D): A POS
ANTIBODY SCREEN: NEGATIVE
UNIT DIVISION: 0
UNIT DIVISION: 0

## 2015-07-13 ENCOUNTER — Ambulatory Visit: Payer: Self-pay | Admitting: Advanced Practice Midwife

## 2015-07-18 HISTORY — PX: DILATION AND CURETTAGE OF UTERUS: SHX78

## 2015-07-21 ENCOUNTER — Encounter: Payer: Self-pay | Admitting: Obstetrics and Gynecology

## 2015-07-21 ENCOUNTER — Other Ambulatory Visit (INDEPENDENT_AMBULATORY_CARE_PROVIDER_SITE_OTHER): Payer: Medicaid Other

## 2015-07-21 ENCOUNTER — Other Ambulatory Visit: Payer: Self-pay | Admitting: Obstetrics and Gynecology

## 2015-07-21 ENCOUNTER — Other Ambulatory Visit (HOSPITAL_COMMUNITY)
Admission: RE | Admit: 2015-07-21 | Discharge: 2015-07-21 | Disposition: A | Discharge status: Home / Self Care | Payer: Medicaid Other | Source: Ambulatory Visit | Attending: Obstetrics and Gynecology | Admitting: Obstetrics and Gynecology

## 2015-07-21 ENCOUNTER — Ambulatory Visit (INDEPENDENT_AMBULATORY_CARE_PROVIDER_SITE_OTHER): Payer: Medicaid Other | Admitting: Obstetrics and Gynecology

## 2015-07-21 VITALS — BP 100/60 | Ht 65.0 in | Wt 200.0 lb

## 2015-07-21 DIAGNOSIS — O3680X0 Pregnancy with inconclusive fetal viability, not applicable or unspecified: Secondary | ICD-10-CM

## 2015-07-21 DIAGNOSIS — O009 Unspecified ectopic pregnancy without intrauterine pregnancy: Secondary | ICD-10-CM | POA: Insufficient documentation

## 2015-07-21 DIAGNOSIS — O00102 Left tubal pregnancy without intrauterine pregnancy: Secondary | ICD-10-CM

## 2015-07-21 DIAGNOSIS — K661 Hemoperitoneum: Secondary | ICD-10-CM | POA: Insufficient documentation

## 2015-07-21 DIAGNOSIS — D649 Anemia, unspecified: Secondary | ICD-10-CM | POA: Diagnosis not present

## 2015-07-21 LAB — COMPREHENSIVE METABOLIC PANEL
ALBUMIN: 4.2 g/dL (ref 3.5–5.0)
ALT: 16 U/L (ref 14–54)
ANION GAP: 7 (ref 5–15)
AST: 18 U/L (ref 15–41)
Alkaline Phosphatase: 78 U/L (ref 38–126)
BILIRUBIN TOTAL: 0.7 mg/dL (ref 0.3–1.2)
BUN: 11 mg/dL (ref 6–20)
CHLORIDE: 103 mmol/L (ref 101–111)
CO2: 23 mmol/L (ref 22–32)
Calcium: 9 mg/dL (ref 8.9–10.3)
Creatinine, Ser: 0.56 mg/dL (ref 0.44–1.00)
GFR calc Af Amer: >60 mL/min (ref >60)
GFR calc non Af Amer: >60 mL/min (ref >60)
GLUCOSE: 104 mg/dL — AB (ref 65–99)
POTASSIUM: 3.8 mmol/L (ref 3.5–5.1)
SODIUM: 133 mmol/L — AB (ref 135–145)
TOTAL PROTEIN: 7.8 g/dL (ref 6.5–8.1)

## 2015-07-21 LAB — CBC WITH DIFFERENTIAL/PLATELET
BASOS ABS: 0 10*3/uL (ref 0.0–0.1)
BASOS PCT: 0 %
EOS ABS: 0.1 10*3/uL (ref 0.0–0.7)
EOS PCT: 1 %
HCT: 29.4 % — ABNORMAL LOW (ref 36.0–46.0)
Hemoglobin: 8.7 g/dL — ABNORMAL LOW (ref 12.0–15.0)
Lymphocytes Relative: 21 %
Lymphs Abs: 2.2 10*3/uL (ref 0.7–4.0)
MCH: 22.7 pg — ABNORMAL LOW (ref 26.0–34.0)
MCHC: 29.6 g/dL — AB (ref 30.0–36.0)
MCV: 76.6 fL — ABNORMAL LOW (ref 78.0–100.0)
MONO ABS: 0.8 10*3/uL (ref 0.1–1.0)
MONOS PCT: 7 %
NEUTROS ABS: 7.6 10*3/uL (ref 1.7–7.7)
Neutrophils Relative %: 71 %
PLATELETS: 520 10*3/uL — AB (ref 150–400)
RBC: 3.84 MIL/uL — ABNORMAL LOW (ref 3.87–5.11)
RDW: 20.1 % — AB (ref 11.5–15.5)
WBC: 10.8 10*3/uL — ABNORMAL HIGH (ref 4.0–10.5)

## 2015-07-21 LAB — HCG, QUANTITATIVE, PREGNANCY: HCG, BETA CHAIN, QUANT, S: 465 m[IU]/mL — AB (ref <5)

## 2015-07-21 MED ORDER — PRENATAL VITAMINS 0.8 MG PO TABS: 1.0000 | ORAL_TABLET | Freq: Every day | ORAL | Status: DC

## 2015-07-21 NOTE — Progress Notes (Signed)
US TA/TV: normal anteverted uterus with No IUP seen,EEC 1.57mm,normal rt ov,4.5 x 4.6 x 3.5cm left adnexal mass ,unable to separate mass from lt ov,no free fluid seen,lt adnexal pain during ultrasound

## 2015-07-21 NOTE — Progress Notes (Addendum)
Patient ID: Jaclyn FloorCandy L Hast, female   DOB: [birth year], 35 y.o.   MRN: [id number]    [facility]  Patient name: Jaclyn Thornton MRN [id number]  Date of birth: [birth year]  CC & HPI:  Jaclyn Thornton is a 35 y.o. female presenting today for ER f/u of + preg test 12/23 , and transfusion for anemia with hgb  6.8. [facility], pt now presents to our office for the first followup of ED visit.  ROS:  Hx anemia, transfused x 1 unit prbc in Dec for hgb 6+  Pertinent History Reviewed:   Reviewed: Significant for works in [employer]         Past Medical History  Diagnosis Date  . Migraines                               Surgical Hx:    Past Surgical History  Procedure Laterality Date  . Tubal ligation     Medications: Reviewed & Updated - see associated section                       Current outpatient prescriptions:  .  acetaminophen (TYLENOL) 500 MG tablet, Take 500 mg by mouth every 6 (six) hours as needed for headache. , Disp: , Rfl:  .  Aspirin-Salicylamide-Caffeine (BC HEADACHE POWDER PO), Take 1 packet by mouth daily as needed (headache)., Disp: , Rfl:    Social History: Reviewed -  reports that she has never smoked. She does not have any smokeless tobacco history on file.  Objective Findings:  Vitals: Blood pressure 100/60, height 5\' 5"  (1.651 m), weight 200 lb (90.719 kg), last menstrual period 05/26/2015.  Physical Examination: General appearance - alert, well appearing, and in no distress Mental status - alert, oriented to person, place, and time, normal mood, behavior, speech, dress, motor activity, and thought processes Abdomen - soft, nontender, nondistended, no masses or organomegaly Pelvic deferred. Will schedule u/s  Assessment & Plan:   A:  1. Pregnancy unknown location  P:  1. Ob u/s today    Addendum: 5a:30 PM: u/s done U/s notes an empty uterus with a thin endometrial stripe and a 4 x4x3 cm mass  in the left adnexa that appears to be ovary plus probably ectopic. No significant intraperitoneal blood. Will order Qhcg and additional labs  07/22/15. 6 am  Labs show a declining HCG of 440, down from 2928. Findings consistent with unruptured left ectopic that is spontaneously reabsorbing. Hgb acceptable at 8.7.  Blood type A POS.  Pt may be able to be managed medically with Methotrexate at this point, but with prior tubal sterilization, will discuss surgical options as sterilization is incomplete and risk of recurrent ectopic exists. Will contact pt for f/u today.

## 2015-07-21 NOTE — Addendum Note (Signed)
Addended by: Richardson ChiquitoRAVIS, ASHLEY M on: 07/21/2015 05:19 PM   Modules accepted: Orders

## 2015-07-21 NOTE — Progress Notes (Signed)
Patient ID: Jaclyn Thornton, female   DOB: 1980-10-14, 35 y.o.   MRN: [id number] Pt here today for follow up. Pt states that she has not had any pain or bleeding and has been able to work.

## 2015-07-21 NOTE — Patient Instructions (Addendum)
Call our office tomorrow for result of pregnancy test  .Methotrexate Treatment for an Ectopic Pregnancy Methotrexate is a medicine that treats ectopic pregnancy by stopping the growth of the fertilized egg. It also helps your body absorb tissue from the egg. This takes between 2 weeks and 6 weeks. Most ectopic pregnancies can be successfully treated with methotrexate if they are detected early enough. LET [facility] KNOW ABOUT:  Any allergies you have.  All medicines     you are taking, including vitamins, herbs, eye drops, creams, and over-the-counter medicines.  Medical conditions you have. RISKS AND COMPLICATIONS Generally, this is a safe treatment. However, as with any treatment, problems can occur. Possible problems or side effects include:  Nausea.  Vomiting.  Diarrhea.  Abdominal cramping.  Mouth sores.  Increased vaginal bleeding or spotting.   Swelling or irritation of the lining of your lungs (pneumonitis).  Failed treatment and continuation of the pregnancy.   Liver damage.  Hair loss. There is still a risk of the ectopic pregnancy rupturing while using the methotrexate. BEFORE THE PROCEDURE Before you take the medicine:   Liver tests, kidney tests, and a complete blood test are performed.  Blood tests are performed to measure the pregnancy hormone levels and to determine your blood type.  If you are Rh-negative and the father is Rh-positive or his Rh type is not known, you will be given a Rho (D) immune globulin shot. PROCEDURE  There are two methods that your health care provider may use to prescribe methotrexate. One method involves a single dose or injection of the medicine. Another method involves a series of doses given through several injections.  AFTER THE PROCEDURE  You may have some abdominal cramping, vaginal bleeding, and fatigue in the first few days after taking methotrexate.  Blood tests will be taken for several weeks to  check the pregnancy hormone levels. The blood tests are performed until there is no more pregnancy hormone detected in the blood.   This information is not intended to replace advice given to you by your health care provider. Make sure you discuss any questions you have with your health care provider.  Ectopic Pregnancy An ectopic pregnancy is when the fertilized egg attaches (implants) outside the uterus. Most ectopic pregnancies occur in the fallopian tube. Rarely do ectopic pregnancies occur on the ovary, intestine, pelvis, or cervix. In an ectopic pregnancy, the fertilized egg does not have the ability to develop into a normal, healthy baby.  A ruptured ectopic pregnancy is one in which the fallopian tube gets torn or bursts and results in internal bleeding. Often there is intense abdominal pain, and sometimes, vaginal bleeding. Having an ectopic pregnancy can be life threatening. If left untreated, this dangerous condition can lead to a blood transfusion, abdominal surgery, or even death. CAUSES  Damage to the fallopian tubes is the suspected cause in most ectopic pregnancies.  RISK FACTORS Depending on your circumstances, the risk of having an ectopic pregnancy will vary. The level of risk can be divided into three categories. High Risk  You have gone through infertility treatment.  You have had a previous ectopic pregnancy.  You have had previous tubal surgery.  You have had previous surgery to have the fallopian tubes tied (tubal ligation).  You have tubal problems or diseases.  You have been exposed to DES. DES is a medicine that was used until 1971 and had effects on babies whose mothers took the medicine.  You become pregnant while  using an intrauterine device (IUD) for birth control. Moderate Risk  You have a history of infertility.  You have a history of a sexually transmitted infection (STI).  You have a history of pelvic inflammatory disease (PID).  You have scarring  from endometriosis.  You have multiple sexual partners.  You smoke. Low Risk  You have had previous pelvic surgery.  You use vaginal douching.  You became sexually active before 37 years of age. SIGNS AND SYMPTOMS  An ectopic pregnancy should be suspected in anyone who has missed a period and has abdominal pain or bleeding.  You may experience normal pregnancy symptoms, such as:  Nausea.  Tiredness.  Breast tenderness.  Other symptoms may include:  Pain with intercourse.  Irregular vaginal bleeding or spotting.  Cramping or pain on one side or in the lower abdomen.  Fast heartbeat.  Passing out while having a bowel movement.  Symptoms of a ruptured ectopic pregnancy and internal bleeding may include:  Sudden, severe pain in the abdomen and pelvis.  Dizziness or fainting.  Pain in the shoulder area. DIAGNOSIS  Tests that may be performed include:  A pregnancy test.  An ultrasound test.  Testing the specific level of pregnancy hormone in the bloodstream.  Taking a sample of uterus tissue (dilation and curettage, D&C).  Surgery to perform a visual exam of the inside of the abdomen using a thin, lighted tube with a tiny camera on the end (laparoscope). TREATMENT  An injection of a medicine called methotrexate may be given. This medicine causes the pregnancy tissue to be absorbed. It is given if:  The diagnosis is made early.  The fallopian tube has not ruptured.  You are considered to be a good candidate for the medicine. Usually, pregnancy hormone blood levels are checked after methotrexate treatment. This is to be sure the medicine is effective. It may take 4-6 weeks for the pregnancy to be absorbed (though most pregnancies will be absorbed by 3 weeks). Surgical treatment may be needed. A laparoscope may be used to remove the pregnancy tissue. If severe internal bleeding occurs, a cut (incision) may be made in the lower abdomen (laparotomy), and the  ectopic pregnancy is removed. This stops the bleeding. Part of the fallopian tube, or the whole tube, may be removed as well (salpingectomy). After surgery, pregnancy hormone tests may be done to be sure there is no pregnancy tissue left. You may receive a Rho (D) immune globulin shot if you are Rh negative and the father is Rh positive, or if you do not know the Rh type of the father. This is to prevent problems with any future pregnancy. SEEK IMMEDIATE MEDICAL CARE IF:  You have any symptoms of an ectopic pregnancy. This is a medical emergency. MAKE SURE YOU:  Understand these instructions.  Will watch your condition.  Will get help right away if you are not doing well or get worse.   This information is not intended to replace advice given to you by your health care provider. Make sure you discuss any questions you have with your health care provider.   Document Released: 08/10/2004 Document Revised: 07/24/2014 Document Reviewed: 01/30/2013 Elsevier Interactive Patient Education 2016 Elsevier Inc.  Document Released: 06/27/2001 Document Revised: 07/24/2014 Document Reviewed: 04/21/2013 Elsevier Interactive Patient Education [employer]

## 2015-07-21 NOTE — Addendum Note (Signed)
Addended by: Tilda BurrowFERGUSON, Torion Hulgan V on: 07/21/2015 05:08 PM   Modules accepted: Orders

## 2015-07-22 ENCOUNTER — Encounter (HOSPITAL_COMMUNITY)
Admission: RE | Admit: 2015-07-22 | Discharge: 2015-07-22 | Disposition: A | Discharge status: Home / Self Care | Payer: Self-pay | Source: Ambulatory Visit | Attending: Obstetrics and Gynecology | Admitting: Obstetrics and Gynecology

## 2015-07-22 ENCOUNTER — Encounter (HOSPITAL_COMMUNITY)
Admission: RE | Admit: 2015-07-22 | Discharge: 2015-07-22 | Disposition: A | Discharge status: Home / Self Care | Payer: Medicaid Other | Source: Ambulatory Visit | Attending: Obstetrics and Gynecology | Admitting: Obstetrics and Gynecology

## 2015-07-22 ENCOUNTER — Telehealth: Payer: Self-pay | Admitting: Obstetrics and Gynecology

## 2015-07-22 ENCOUNTER — Other Ambulatory Visit: Payer: Self-pay | Admitting: Obstetrics and Gynecology

## 2015-07-22 ENCOUNTER — Encounter (HOSPITAL_COMMUNITY): Payer: Self-pay

## 2015-07-22 DIAGNOSIS — Z01818 Encounter for other preprocedural examination: Secondary | ICD-10-CM | POA: Diagnosis present

## 2015-07-22 HISTORY — DX: Unspecified ectopic pregnancy without intrauterine pregnancy: O00.90

## 2015-07-22 HISTORY — DX: Anemia, unspecified: D64.9

## 2015-07-22 LAB — PREPARE RBC (CROSSMATCH)

## 2015-07-22 LAB — CBC
HCT: 26.6 % — ABNORMAL LOW (ref 36.0–46.0)
Hemoglobin: 7.8 g/dL — ABNORMAL LOW (ref 12.0–15.0)
MCH: 22.4 pg — ABNORMAL LOW (ref 26.0–34.0)
MCHC: 29.3 g/dL — ABNORMAL LOW (ref 30.0–36.0)
MCV: 76.4 fL — ABNORMAL LOW (ref 78.0–100.0)
Platelets: 482 10*3/uL — ABNORMAL HIGH (ref 150–400)
RBC: 3.48 MIL/uL — ABNORMAL LOW (ref 3.87–5.11)
RDW: 19.8 % — ABNORMAL HIGH (ref 11.5–15.5)
WBC: 10.8 10*3/uL — ABNORMAL HIGH (ref 4.0–10.5)

## 2015-07-22 LAB — BETA HCG QUANT (REF LAB): Beta hCG, Tumor Marker: 440 m[IU]/mL

## 2015-07-22 NOTE — Patient Instructions (Signed)
Jaclyn Thornton  07/22/2015     @PREFPERIOPPHARMACY @   Your procedure is scheduled on 07/23/2015.  [facility]  Call this number if you have problems the morning of surgery:  [phone]   Remember:  Do not eat food or drink liquids after midnight.  Take these medicines the morning of surgery with A SIP OF WATER NA   Do not wear jewelry, make-up or nail polish.  Do not wear lotions, powders, or perfumes.  You may wear deodorant.  Do not shave 48 hours prior to surgery.  Men may shave face and neck.  Do not bring valuables to the hospital.  [facility] any belongings or valuables.  Contacts, dentures or bridgework may not be worn into surgery.  Leave your suitcase in the car.  After surgery it may be brought to your room.  For patients admitted to the hospital, discharge time will be determined by your treatment team.  Patients discharged the day of surgery will not be allowed to drive home.    Please read over the following fact sheets that you were given. Surgical Site Infection Prevention and Anesthesia Post-op Instructions     PATIENT INSTRUCTIONS POST-ANESTHESIA  IMMEDIATELY FOLLOWING SURGERY:  Do not drive or operate machinery for the first twenty four hours after surgery.  Do not make any important decisions for twenty four hours after surgery or while taking narcotic pain medications or sedatives.  If you develop intractable nausea and vomiting or a severe headache please notify your doctor immediately.  FOLLOW-UP:  Please make an appointment with your surgeon as instructed. You do not need to follow up with anesthesia unless specifically instructed to do so.  WOUND CARE INSTRUCTIONS (if applicable):  Keep a dry clean dressing on the anesthesia/puncture wound site if there is drainage.  Once the wound has quit draining you may leave it open to air.  Generally you should leave the bandage intact for twenty four hours unless  there is drainage.  If the epidural site drains for more than 36-48 hours please call the anesthesia department.  QUESTIONS?:  Please feel free to call your physician or the hospital operator if you have any questions, and they will be happy to assist you.      Salpingectomy Salpingectomy, also called tubectomy, is the surgical removal of one of the fallopian tubes. The fallopian tubes are tubes that are connected to the uterus. These tubes transport the egg from the ovary to the uterus. A salpingectomy may be done for various reasons, including:   A tubal (ectopic) pregnancy. This is especially true if the tube ruptures.  An infected fallopian tube.  The need to remove the fallopian tube when removing an ovary with a cyst or tumor.  The need to remove the fallopian tube when removing the uterus.  Cancer of the fallopian tube or nearby organs. Removing one fallopian tube does not prevent you from becoming pregnant. It also does not cause problems with your menstrual periods.  LET [facility] KNOW ABOUT:  Any allergies you have.  All medicines you are taking, including vitamins, herbs, eye drops, creams, and over-the-counter medicines.  Previous problems you or members of your family have had with the use of anesthetics.  Any blood disorders you have.  Previous surgeries you have had.  Medical conditions you have. RISKS AND COMPLICATIONS  Generally, this is a safe procedure. However, as with any procedure, complications can  occur. Possible complications include:  Injury to surrounding organs.  Bleeding.  Infection.  Problems related to anesthesia. BEFORE THE PROCEDURE  Ask your health care provider about changing or stopping your regular medicines. You may need to stop taking certain medicines, such as aspirin or blood thinners, at least 1 week before the surgery.  Do not eat or drink anything for at least 8 hours before the surgery.  If you smoke, do not  smoke for at least 2 weeks before the surgery.  Make plans to have someone drive you home after the procedure or after your hospital stay. Also arrange for someone to help you with activities during recovery. PROCEDURE   You will be given medicine to help you relax before the procedure (sedative). You will then be given medicine to make you sleep through the procedure (general anesthetic). These medicines will be given through an IV access tube that is put into one of your veins.  Once you are asleep, your lower abdomen will be shaved and cleaned. A thin, flexible tube (catheter) will be placed in your bladder.  The surgeon may use a laparoscopic, robotic, or open technique for this surgery:  In the laparoscopic technique, the surgery is done through two small cuts (incisions) in the abdomen. A thin, lighted tube with a tiny camera on the end (laparoscope) is inserted into one of the incisions. The tools needed for the procedure are put through the other incision.  A robotic technique may be chosen to perform complex surgery in a small space. In the robotic technique, small incisions will be made. A camera and surgical instruments are passed through the incisions. Surgical instruments will be controlled with the help of a robotic arm.  In the open technique, the surgery is done through one large incision in the abdomen.  Using any of these techniques, the surgeon removes the fallopian tube from where it attaches to the uterus. The blood vessels will be clamped and tied.  The surgeon then uses staples or stitches to close the incision or incisions. AFTER THE PROCEDURE   You will be taken to a recovery area where your progress will be monitored for 1-3 hours.  If the laparoscopic technique was used, you may be allowed to go home after several hours. You may have some shoulder pain after the laparoscopic procedure. This is normal and usually goes away in a day or two.  If the open technique  was used, you will be admitted to the hospital for a couple of days.  You will be given pain medicine if needed.  The IV access tube and catheter will be removed before you are discharged.   This information is not intended to replace advice given to you by your health care provider. Make sure you discuss any questions you have with your health care provider.   Document Released: 11/19/2008 Document Revised: 07/24/2014 Document Reviewed: 12/25/2012 Elsevier Interactive Patient Education [employer].

## 2015-07-22 NOTE — Addendum Note (Signed)
Addended by: Tilda BurrowFERGUSON, Dyshon Philbin V on: 07/22/2015 06:26 AM   Modules accepted: Level of Service

## 2015-07-22 NOTE — Telephone Encounter (Signed)
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

## 2015-07-22 NOTE — Pre-Procedure Instructions (Signed)
Hgb 7.8 called to Dr Tyrell AntonioFurgeson.  Type and Crossmatch added to labs.

## 2015-07-23 ENCOUNTER — Ambulatory Visit (HOSPITAL_COMMUNITY)
Admission: RE | Admit: 2015-07-23 | Discharge: 2015-07-23 | Disposition: A | Discharge status: Home / Self Care | Payer: Medicaid Other | Source: Ambulatory Visit | Attending: Obstetrics and Gynecology | Admitting: Obstetrics and Gynecology

## 2015-07-23 ENCOUNTER — Ambulatory Visit (HOSPITAL_COMMUNITY): Payer: Medicaid Other | Admitting: Anesthesiology

## 2015-07-23 ENCOUNTER — Encounter (HOSPITAL_COMMUNITY): Payer: Self-pay | Admitting: *Deleted

## 2015-07-23 ENCOUNTER — Encounter (HOSPITAL_COMMUNITY)
Admission: RE | Disposition: A | Discharge status: Home / Self Care | Payer: Self-pay | Source: Ambulatory Visit | Attending: Obstetrics and Gynecology

## 2015-07-23 DIAGNOSIS — O009 Unspecified ectopic pregnancy without intrauterine pregnancy: Secondary | ICD-10-CM | POA: Insufficient documentation

## 2015-07-23 DIAGNOSIS — D649 Anemia, unspecified: Secondary | ICD-10-CM | POA: Diagnosis not present

## 2015-07-23 HISTORY — PX: LAPAROSCOPIC BILATERAL SALPINGECTOMY: SHX5889

## 2015-07-23 LAB — HEMOGLOBIN AND HEMATOCRIT, BLOOD
HEMATOCRIT: 29.2 % — AB (ref 36.0–46.0)
HEMOGLOBIN: 8.7 g/dL — AB (ref 12.0–15.0)

## 2015-07-23 SURGERY — SALPINGECTOMY, BILATERAL, LAPAROSCOPIC
Anesthesia: General | Site: Abdomen | Laterality: Bilateral

## 2015-07-23 MED ORDER — LACTATED RINGERS IV SOLN
INTRAVENOUS | Status: DC
  Administered 2015-07-23: 16:00:00 via INTRAVENOUS

## 2015-07-23 MED ORDER — BUPIVACAINE HCL (PF) 0.5 % IJ SOLN
INTRAMUSCULAR | Status: AC
  Filled 2015-07-23: qty 30

## 2015-07-23 MED ORDER — FENTANYL CITRATE (PF) 100 MCG/2ML IJ SOLN
INTRAMUSCULAR | Status: DC | PRN
  Administered 2015-07-23: 100 ug via INTRAVENOUS
  Administered 2015-07-23 (×3): 50 ug via INTRAVENOUS

## 2015-07-23 MED ORDER — SODIUM CHLORIDE 0.9 % IV SOLN
INTRAVENOUS | Status: DC | PRN
  Administered 2015-07-23: 12:00:00 via INTRAVENOUS

## 2015-07-23 MED ORDER — MIDAZOLAM HCL 2 MG/2ML IJ SOLN
1.0000 mg | INTRAMUSCULAR | Status: DC | PRN
  Administered 2015-07-23 (×2): 2 mg via INTRAVENOUS

## 2015-07-23 MED ORDER — ONDANSETRON HCL 4 MG/2ML IJ SOLN: 4.0000 mg | Freq: Once | INTRAMUSCULAR | Status: DC | PRN

## 2015-07-23 MED ORDER — PROPOFOL 10 MG/ML IV BOLUS
INTRAVENOUS | Status: DC | PRN
  Administered 2015-07-23: 50 mg via INTRAVENOUS
  Administered 2015-07-23: 150 mg via INTRAVENOUS

## 2015-07-23 MED ORDER — ROCURONIUM BROMIDE 100 MG/10ML IV SOLN
INTRAVENOUS | Status: DC | PRN
  Administered 2015-07-23: 10 mg via INTRAVENOUS
  Administered 2015-07-23: 25 mg via INTRAVENOUS
  Administered 2015-07-23: 5 mg via INTRAVENOUS

## 2015-07-23 MED ORDER — MIDAZOLAM HCL 2 MG/2ML IJ SOLN
INTRAMUSCULAR | Status: AC
  Filled 2015-07-23: qty 2

## 2015-07-23 MED ORDER — ONDANSETRON HCL 4 MG/2ML IJ SOLN
INTRAMUSCULAR | Status: AC
  Filled 2015-07-23: qty 2

## 2015-07-23 MED ORDER — GLYCOPYRROLATE 0.2 MG/ML IJ SOLN
INTRAMUSCULAR | Status: DC | PRN
  Administered 2015-07-23: 0.6 mg via INTRAVENOUS

## 2015-07-23 MED ORDER — SODIUM CHLORIDE 0.9 % IV SOLN
Freq: Once | INTRAVENOUS | Status: AC
  Administered 2015-07-23: 13:00:00 via INTRAVENOUS

## 2015-07-23 MED ORDER — OXYCODONE-ACETAMINOPHEN 5-325 MG PO TABS: 1.0000 | ORAL_TABLET | ORAL | Status: DC | PRN

## 2015-07-23 MED ORDER — GLYCOPYRROLATE 0.2 MG/ML IJ SOLN
INTRAMUSCULAR | Status: AC
  Filled 2015-07-23: qty 2

## 2015-07-23 MED ORDER — FENTANYL CITRATE (PF) 250 MCG/5ML IJ SOLN
INTRAMUSCULAR | Status: AC
  Filled 2015-07-23: qty 5

## 2015-07-23 MED ORDER — LIDOCAINE HCL (CARDIAC) 10 MG/ML IV SOLN
INTRAVENOUS | Status: DC | PRN
  Administered 2015-07-23: 50 mg via INTRAVENOUS

## 2015-07-23 MED ORDER — FENTANYL CITRATE (PF) 100 MCG/2ML IJ SOLN
25.0000 ug | INTRAMUSCULAR | Status: DC | PRN
  Administered 2015-07-23: 50 ug via INTRAVENOUS
  Filled 2015-07-23: qty 2

## 2015-07-23 MED ORDER — BUPIVACAINE HCL (PF) 0.5 % IJ SOLN
INTRAMUSCULAR | Status: DC | PRN
  Administered 2015-07-23: 10 mL

## 2015-07-23 MED ORDER — ONDANSETRON HCL 4 MG/2ML IJ SOLN
4.0000 mg | Freq: Once | INTRAMUSCULAR | Status: AC
Start: 2015-07-23 — End: 2015-07-23
  Administered 2015-07-23: 4 mg via INTRAVENOUS

## 2015-07-23 MED ORDER — NEOSTIGMINE METHYLSULFATE 10 MG/10ML IV SOLN
INTRAVENOUS | Status: DC | PRN
  Administered 2015-07-23: 3 mg via INTRAVENOUS

## 2015-07-23 MED ORDER — 0.9 % SODIUM CHLORIDE (POUR BTL) OPTIME
TOPICAL | Status: DC | PRN
  Administered 2015-07-23: 500 mL

## 2015-07-23 SURGICAL SUPPLY — 45 items
BAG HAMPER (MISCELLANEOUS) ×3 IMPLANT
BLADE SURG SZ11 CARB STEEL (BLADE) ×3 IMPLANT
CATH ROBINSON RED A/P 16FR (CATHETERS) ×3 IMPLANT
CLOSURE STERI-STRIP 1/4X4 (GAUZE/BANDAGES/DRESSINGS) ×3 IMPLANT
CLOSURE WOUND 1/4 X3 (GAUZE/BANDAGES/DRESSINGS) ×1
CLOTH BEACON ORANGE TIMEOUT ST (SAFETY) ×3 IMPLANT
COVER LIGHT HANDLE STERIS (MISCELLANEOUS) ×6 IMPLANT
DECANTER SPIKE VIAL GLASS SM (MISCELLANEOUS) ×3 IMPLANT
DURAPREP 26ML APPLICATOR (WOUND CARE) ×3 IMPLANT
ELECT REM PT RETURN 9FT ADLT (ELECTROSURGICAL) ×3
ELECTRODE REM PT RTRN 9FT ADLT (ELECTROSURGICAL) ×1 IMPLANT
FORMALIN 10 PREFIL 120ML (MISCELLANEOUS) ×3 IMPLANT
GLOVE BIOGEL PI IND STRL 7.0 (GLOVE) ×1 IMPLANT
GLOVE BIOGEL PI IND STRL 9 (GLOVE) ×1 IMPLANT
GLOVE BIOGEL PI INDICATOR 7.0 (GLOVE) ×2
GLOVE BIOGEL PI INDICATOR 9 (GLOVE) ×2
GLOVE ECLIPSE 9.0 STRL (GLOVE) ×6 IMPLANT
GLOVE EXAM NITRILE MD LF STRL (GLOVE) ×3 IMPLANT
GOWN SPEC L3 XXLG W/TWL (GOWN DISPOSABLE) ×3 IMPLANT
GOWN STRL REUS W/TWL LRG LVL3 (GOWN DISPOSABLE) ×3 IMPLANT
INST SET LAPROSCOPIC GYN AP (KITS) ×3 IMPLANT
KIT ROOM TURNOVER APOR (KITS) ×3 IMPLANT
NEEDLE HYPO 21X1.5 SAFETY (NEEDLE) ×3 IMPLANT
NEEDLE INSUFFLATION 120MM (ENDOMECHANICALS) ×3 IMPLANT
NS IRRIG 1000ML POUR BTL (IV SOLUTION) ×3 IMPLANT
PACK PERI GYN (CUSTOM PROCEDURE TRAY) ×3 IMPLANT
PAD ARMBOARD 7.5X6 YLW CONV (MISCELLANEOUS) ×3 IMPLANT
POUCH SPECIMEN RETRIEVAL 10MM (ENDOMECHANICALS) ×3 IMPLANT
SET BASIN LINEN APH (SET/KITS/TRAYS/PACK) ×3 IMPLANT
SET IRRIG TUBING LAPAROSCOPIC (IRRIGATION / IRRIGATOR) IMPLANT
SHEARS HARMONIC ACE PLUS 36CM (ENDOMECHANICALS) ×3 IMPLANT
SLEEVE ENDOPATH XCEL 5M (ENDOMECHANICALS) ×3 IMPLANT
SOLUTION ANTI FOG 6CC (MISCELLANEOUS) ×3 IMPLANT
SPONGE GAUZE 2X2 8PLY STER LF (GAUZE/BANDAGES/DRESSINGS) ×4
SPONGE GAUZE 2X2 8PLY STRL LF (GAUZE/BANDAGES/DRESSINGS) ×8 IMPLANT
STRIP CLOSURE SKIN 1/4X3 (GAUZE/BANDAGES/DRESSINGS) ×2 IMPLANT
SUT VIC AB 4-0 PS2 27 (SUTURE) ×3 IMPLANT
SYR BULB IRRIGATION 50ML (SYRINGE) ×3 IMPLANT
SYR CONTROL 10ML LL (SYRINGE) ×3 IMPLANT
SYRINGE 10CC LL (SYRINGE) ×3 IMPLANT
TAPE CLOTH SURG 4X10 WHT LF (GAUZE/BANDAGES/DRESSINGS) ×3 IMPLANT
TROCAR ENDO BLADELESS 11MM (ENDOMECHANICALS) ×3 IMPLANT
TROCAR XCEL NON-BLD 5MMX100MML (ENDOMECHANICALS) ×3 IMPLANT
TUBING INSUFFLATION (TUBING) ×3 IMPLANT
WARMER LAPAROSCOPE (MISCELLANEOUS) ×3 IMPLANT

## 2015-07-23 NOTE — Brief Op Note (Signed)
07/23/2015  3:52 PM  PATIENT:  Jaclyn Thornton  35 y.o. female  PRE-OPERATIVE DIAGNOSIS:  left ectopic pregnancy, h/o prior sterilization  POST-OPERATIVE DIAGNOSIS:  left ectopic pregnancy, h/o prior sterilization  PROCEDURE:  Procedure(s): LAPAROSCOPIC BILATERAL SALPINGECTOMY (Bilateral)  SURGEON:  Surgeon(s) and Role:    * Tilda Burrow, MD - Primary  PHYSICIAN ASSISTANT:   ASSISTANTS: none   ANESTHESIA:   general  EBL:  Total I/O In: 1300 [I.V.:1000; Blood:300] Out: -   BLOOD ADMINISTERED:none  DRAINS: none   LOCAL MEDICATIONS USED:  LIDOCAINE   SPECIMEN:  Source of Specimen:  Bilateral fallopian tubes  DISPOSITION OF SPECIMEN:  PATHOLOGY  COUNTS:  YES  TOURNIQUET:  * No tourniquets in log *  DICTATION: .Dragon Dictation  PLAN OF CARE: Discharge to home after PACU  PATIENT DISPOSITION:  PACU - hemodynamically stable.   Delay start of Pharmacological VTE agent (>24hrs) due to surgical blood loss or risk of bleeding: not applicable Details of procedure; Patient was taken operating room prepped and draped for combined abdominal and vaginal procedure. Foley catheter was inserted into the bladder, and the cervix was grasped with a single-tooth tenaculum for manipulation of the uterus. Timeout was conducted prior to start of the case and Ancef administered. The subumbilical vertical 2 cm incision skin incision was made as well as a transverse suprapubic and right lower quadrant incision of similar length. Veress needle was used through the umbilicus and water droplet technique confirmed intraperitoneal location. Pneumoperitoneum was achieved under low pressure, and then direct insertion of the lot laparoscopic trocar was performed with laparoscopic visualization the procedure. Suprapubic and right lower quadrant trochars were inserted under direct visualization. Attention was directed to the pelvis and thin filmy adhesions to the adnexal structures were dissected  free. Attention was then directed to the pelvis. All able to be elevated off the adnexa we were able to identify complex in the left adnexa that consisted of the ovary, hemorrhagic tissue from the 3 extending to include the distal left fallopian tube. It was felt that this represented a distal ampullary ectopic pregnancy with adhesion to the left ovary. With manipulation we were able to find a cleavage plane that seemed to separate the ectopic and attached hemorrhage from the ovary using the Harmonic scalpel and beginning at the cornu of the uterus the old tubal stump was removed and extracted through the suprapubic site. Attention was directed to the left adnexa and using the harmonic scalpel on the combined coagulation and transection setting we were able to dissect the left distal tube away from the adjacent ovary and place a specimen and suprapubic area. Attention was then directed to the right side where the ovary was normal and the evidence of prior tubal sterilization existed. The proximal tube was amputated off and extracted through the suprapubic site. The distal right tube was then transected using the Harmonic Ace 7 on similar settings and then extracted through the suprapubic site. The 5 mm camera was then used through one of the smaller ports and the Endo Catch bag introduced through the umbilical port and the left distal fallopian tube placed in the Endo Catch bag and extracted through the umbilical site. Pedicles were inspected and confirmed as hemostatic. Saline was used to assist with evacuation of the pneumoperitoneum. Instruments were removed as well as the trochars. The umbilical site was closed with fascial layer using S retractors to identify the fascia, and then 0 Vicryl to actually close the fascial layer. The skin  incision edges were closed at all 3 sites using subcuticular 4-0 Vicryl interrupted sutures patient was allowed to go recovery room in stable condition with Foley catheter  removed upon leaving the operating room, and patient will be allowed to go home the same day. Sponge and needle counts correct EBL minimal

## 2015-07-23 NOTE — Discharge Instructions (Signed)
Diagnostic Laparoscopy °A diagnostic laparoscopy is a procedure to diagnose diseases in the abdomen. During the procedure, a thin, lighted, pencil-sized instrument called a laparoscope is inserted into the abdomen through an incision. The laparoscope allows your health care provider to look at the organs inside your body. °LET YOUR HEALTH CARE PROVIDER KNOW ABOUT: °· Any allergies you have. °· All medicines you are taking, including vitamins, herbs, eye drops, creams, and over-the-counter medicines. °· Previous problems you or members of your family have had with the use of anesthetics. °· Any blood disorders you have. °· Previous surgeries you have had. °· Medical conditions you have. °RISKS AND COMPLICATIONS  °Generally, this is a safe procedure. However, problems can occur, which may include: °· Infection. °· Bleeding. °· Damage to other organs. °· Allergic reaction to the anesthetics used during the procedure. °BEFORE THE PROCEDURE °· Do not eat or drink anything after midnight on the night before the procedure or as directed by your health care provider. °· Ask your health care provider about: °¨ Changing or stopping your regular medicines. °¨ Taking medicines such as aspirin and ibuprofen. These medicines can thin your blood. Do not take these medicines before your procedure if your health care provider instructs you not to. °· Plan to have someone take you home after the procedure. °PROCEDURE °· You may be given a medicine to help you relax (sedative). °· You will be given a medicine to make you sleep (general anesthetic). °· Your abdomen will be inflated with a gas. This will make your organs easier to see. °· Small incisions will be made in your abdomen. °· A laparoscope and other small instruments will be inserted into the abdomen through the incisions. °· A tissue sample may be removed from an organ in the abdomen for examination. °· The instruments will be removed from the abdomen. °· The gas will be  released. °· The incisions will be closed with stitches (sutures). °AFTER THE PROCEDURE  °Your blood pressure, heart rate, breathing rate, and blood oxygen level will be monitored often until the medicines you were given have worn off. °  °This information is not intended to replace advice given to you by your health care provider. Make sure you discuss any questions you have with your health care provider. °  °Document Released: 10/09/2000 Document Revised: 03/24/2015 Document Reviewed: 02/13/2014 °Elsevier Interactive Patient Education ©2016 [employer] °Ectopic Pregnancy °An ectopic pregnancy is when the fertilized egg attaches (implants) outside the uterus. Most ectopic pregnancies occur in the fallopian tube. Rarely do ectopic pregnancies occur on the ovary, intestine, pelvis, or cervix. In an ectopic pregnancy, the fertilized egg does not have the ability to develop into a normal, healthy baby.  °A ruptured ectopic pregnancy is one in which the fallopian tube gets torn or bursts and results in internal bleeding. Often there is intense abdominal pain, and sometimes, vaginal bleeding. Having an ectopic pregnancy can be life threatening. If left untreated, this dangerous condition can lead to a blood transfusion, abdominal surgery, or even death. °CAUSES  °Damage to the fallopian tubes is the suspected cause in most ectopic pregnancies.  °RISK FACTORS °Depending on your circumstances, the risk of having an ectopic pregnancy will vary. The level of risk can be divided into three categories. °High Risk °· You have gone through infertility treatment. °· You have had a previous ectopic pregnancy. °· You have had previous tubal surgery. °· You have had previous surgery to have the fallopian tubes tied (tubal ligation). °·   You have tubal problems or diseases. °· You have been exposed to DES. DES is a medicine that was used until 1971 and had effects on babies whose mothers took the medicine. °· You become pregnant  while using an intrauterine device (IUD) for birth control.  °Moderate Risk °· You have a history of infertility. °· You have a history of a sexually transmitted infection (STI). °· You have a history of pelvic inflammatory disease (PID). °· You have scarring from endometriosis. °· You have multiple sexual partners. °· You smoke.  °Low Risk °· You have had previous pelvic surgery. °· You use vaginal douching. °· You became sexually active before 37 years of age. °SIGNS AND SYMPTOMS  °An ectopic pregnancy should be suspected in anyone who has missed a period and has abdominal pain or bleeding. °· You may experience normal pregnancy symptoms, such as: °· Nausea. °· Tiredness. °· Breast tenderness. °· Other symptoms may include: °· Pain with intercourse. °· Irregular vaginal bleeding or spotting. °· Cramping or pain on one side or in the lower abdomen. °· Fast heartbeat. °· Passing out while having a bowel movement. °· Symptoms of a ruptured ectopic pregnancy and internal bleeding may include: °· Sudden, severe pain in the abdomen and pelvis. °· Dizziness or fainting. °· Pain in the shoulder area. °DIAGNOSIS  °Tests that may be performed include: °· A pregnancy test. °· An ultrasound test. °· Testing the specific level of pregnancy hormone in the bloodstream. °· Taking a sample of uterus tissue (dilation and curettage, D&C). °· Surgery to perform a visual exam of the inside of the abdomen using a thin, lighted tube with a tiny camera on the end (laparoscope). °TREATMENT  °An injection of a medicine called methotrexate may be given. This medicine causes the pregnancy tissue to be absorbed. It is given if: °· The diagnosis is made early. °· The fallopian tube has not ruptured. °· You are considered to be a good candidate for the medicine. °Usually, pregnancy hormone blood levels are checked after methotrexate treatment. This is to be sure the medicine is effective. It may take 4-6 weeks for the pregnancy to be absorbed  (though most pregnancies will be absorbed by 3 weeks). °Surgical treatment may be needed. A laparoscope may be used to remove the pregnancy tissue. If severe internal bleeding occurs, a cut (incision) may be made in the lower abdomen (laparotomy), and the ectopic pregnancy is removed. This stops the bleeding. Part of the fallopian tube, or the whole tube, may be removed as well (salpingectomy). After surgery, pregnancy hormone tests may be done to be sure there is no pregnancy tissue left. You may receive a Rho (D) immune globulin shot if you are Rh negative and the father is Rh positive, or if you do not know the Rh type of the father. This is to prevent problems with any future pregnancy. °SEEK IMMEDIATE MEDICAL CARE IF:  °You have any symptoms of an ectopic pregnancy. This is a medical emergency. °MAKE SURE YOU: °· Understand these instructions. °· Will watch your condition. °· Will get help right away if you are not doing well or get worse. °  °This information is not intended to replace advice given to you by your health care provider. Make sure you discuss any questions you have with your health care provider. °  °Document Released: 08/10/2004 Document Revised: 07/24/2014 Document Reviewed: 01/30/2013 °Elsevier Interactive Patient Education ©2016 Elsevier Inc. ° °

## 2015-07-23 NOTE — H&P (Signed)
Jaclyn Thornton is an 35 y.o. female. 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 Quant HCG levels have declined since christmas from 2500+ to 400's . The patient is s/p tubal ligation, and plans are for bilateral salpingectomy as we do not know which side has the open proximal tube. Pt is without pain,  And without bleeding per vagina, and u/s Wednesday showed no intra-abdominal fluid,    Pertinent Gynecological History: Menses: current amenorrheic due to ectopic. Bleeding: none Contraception: tubal ligation DES exposure: unknown Blood transfusions: we are prepared with T&C today Sexually transmitted diseases: no past history Previous GYN Procedures: tubal ligation  Last mammogram:  Date:  Last pap:  Date:  OB History: G, P   Menstrual History: Menarche age:  Patient's last menstrual period was 05/26/2015 (exact date).    Past Medical History  Diagnosis Date  . Migraines   . Anemia   . Ectopic pregnancy 2016    Past Surgical History  Procedure Laterality Date  . Tubal ligation      No family history on file.  Social History:  reports that she has never smoked. She does not have any smokeless tobacco history on file. She reports that she drinks alcohol. She reports that she does not use illicit drugs.  Allergies: No Known Allergies  No prescriptions prior to admission    ROS  Last menstrual period 05/26/2015, unknown if currently breastfeeding. Physical Exam  Constitutional: She is oriented to person, place, and time. She appears well-developed and well-nourished. No distress.  HENT:  Head: Normocephalic and atraumatic.  Eyes: Pupils are equal, round, and reactive to light.  Cardiovascular: Normal rate.   Respiratory: Effort normal.  GI: Soft. Bowel sounds are normal. She exhibits no distension and no mass. There is no tenderness. There is no rebound and no guarding.  Genitourinary: Vagina normal.  See u/s which identifies  a 4 x 4 x43 cm mass adjacent to left ovary, felt to represent left distal ectopic . Scanty pelvic fluid  Musculoskeletal: Normal range of motion.  Neurological: She is alert and oriented to person, place, and time.  Skin: Skin is warm and dry.  Pale consistent with anemia.  Psychiatric: She has a normal mood and affect. Her behavior is normal. Judgment and thought content normal.    Results for orders placed or performed during the hospital encounter of 07/22/15 (from the past 24 hour(s))  Type and screen Type and Screen on C/S only     Status: None (Preliminary result)   Collection Time: 07/22/15  3:00 PM  Result Value Ref Range   ABO/RH(D) A POS    Antibody Screen NEG    Sample Expiration 08/05/2015    Extend sample reason NO TRANSFUSIONS OR PREGNANCY IN THE PAST 3 MONTHS    Unit Number [id number]    Blood Component Type RED CELLS,LR    Unit division 00    Status of Unit ALLOCATED    Transfusion Status OK TO TRANSFUSE    Crossmatch Result Compatible    Unit Number [id number]    Blood Component Type RED CELLS,LR    Unit division 00    Status of Unit ALLOCATED    Transfusion Status OK TO TRANSFUSE    Crossmatch Result Compatible   CBC     Status: Abnormal   Collection Time: 07/22/15  3:00 PM  Result Value Ref Range   WBC 10.8 (H) 4.0 - 10.5 K/uL   RBC 3.48 (L)  3.87 - 5.11 MIL/uL   Hemoglobin 7.8 (L) 12.0 - 15.0 g/dL   HCT 16.1 (L) 09.6 - 04.5 %   MCV 76.4 (L) 78.0 - 100.0 fL   MCH 22.4 (L) 26.0 - 34.0 pg   MCHC 29.3 (L) 30.0 - 36.0 g/dL   RDW 40.9 (H) 81.1 - 91.4 %   Platelets 482 (H) 150 - 400 K/uL  Prepare RBC     Status: None   Collection Time: 07/22/15  3:00 PM  Result Value Ref Range   Order Confirmation ORDER PROCESSED BY BLOOD BANK     US Ob Comp Less 14 Wks  07/21/2015  DATING AND VIABILITY SONOGRAM ALTHERIA SHADOAN is a 35 y.o. year old G1P0 with LMP 05/26/2015 which would correlate to  [redacted] weeks gestation.  She has regular menstrual cycles.   She is here  today for a confirmatory initial sonogram. GESTATION:    FETAL ACTIVITY:          Heart rate         NO IUP SEEN        CERVIX: Appears closed ADNEXA: 4.5 x 4.6 x 3.5cm left adnexal mass,unable to separate mass from lt ov,normal rt ov GESTATIONAL AGE AND  BIOMETRICS: Gestational criteria: Estimated Date of Delivery: 03/01/2016. by LMP now at 8wks Previous Scans:1 outside [place] GESTATIONAL SAC  NO IUP SEEN LT  Adnexal mass 4.5 x 4.6 x 3.5 cm CROWN RUMP LENGTH                                                                        AVERAGE EGA(BY THIS SCAN):  0 weeks TECHNICIAN COMMENTS: [place] TA/TV: normal anteverted uterus with No IUP seen,EEC 1.93mm,normal rt ov,4.5 x 4.6 x 3.5cm left adnexal mass,unable to separate mass from lt ov,no free fluid seen,lt adnexal pain during ultrasound, pt was seen by Dr.Daleysa Kristiansen after ultrasound. A copy of this report including all images has been saved and backed up to a second source for retrieval if needed. All measures and details of the anatomical scan, placentation, fluid volume and pelvic anatomy are contained in that report. [name] 07/21/2015 4:42 PM   US Ob Transvaginal  07/21/2015  DATING AND VIABILITY SONOGRAM [name] is a 35 y.o. year old G1P0 with LMP 05/26/2015 which would correlate to  [redacted] weeks gestation.  She has regular menstrual cycles.   She is here today for a confirmatory initial sonogram. GESTATION:    FETAL ACTIVITY:          Heart rate         NO IUP SEEN        CERVIX: Appears closed ADNEXA: 4.5 x 4.6 x 3.5cm left adnexal mass,unable to separate mass from lt ov,normal rt ov GESTATIONAL AGE AND  BIOMETRICS: Gestational criteria: Estimated Date of Delivery: 03/01/2016. by LMP now at 8wks Previous Scans:1 outside [place] GESTATIONAL SAC  NO IUP SEEN LT  Adnexal mass 4.5 x 4.6 x 3.5 cm CROWN RUMP LENGTH  AVERAGE EGA(BY THIS SCAN):  0 weeks TECHNICIAN COMMENTS: US TA/TV: normal anteverted uterus  with No IUP seen,EEC 1.367mm,normal rt ov,4.5 x 4.6 x 3.5cm left adnexal mass,unable to separate mass from lt ov,no free fluid seen,lt adnexal pain during ultrasound, pt was seen by Dr.Madi Bonfiglio after ultrasound. A copy of this report including all images has been saved and backed up to a second source for retrieval if needed. All measures and details of the anatomical scan, placentation, fluid volume and pelvic anatomy are contained in that report. Karie Chimeramber J Carl 07/21/2015 4:42 PM    Assessment/Plan: 1. Left chronic ectopic, s.p bilateral salpingectomy 2. Anemia, probable acute on chronic.  Plan to OR today for bilateral salpingectomy, will likely transfuse perioperatively.  Sharron Simpson V 07/23/2015, 11:03 AM

## 2015-07-23 NOTE — Op Note (Signed)
Please see brief op note for details of bilateral salpingectomy

## 2015-07-23 NOTE — Transfer of Care (Signed)
Immediate Anesthesia Transfer of Care Note  Patient: Jaclyn Thornton  Procedure(s) Performed: Procedure(s): LAPAROSCOPIC BILATERAL SALPINGECTOMY (Bilateral)  Patient Location: PACU  Anesthesia Type:General  Level of Consciousness: awake, oriented and patient cooperative  Airway & Oxygen Therapy: Patient Spontanous Breathing and Patient connected to face mask oxygen  Post-op Assessment: Report given to RN and Post -op Vital signs reviewed and stable  Post vital signs: Reviewed and stable  Last Vitals:  Filed Vitals:   07/23/15 1450 07/23/15 1455  BP: 109/76 113/73  Pulse:    Temp:    Resp: 18 27    Complications: No apparent anesthesia complications

## 2015-07-23 NOTE — Anesthesia Postprocedure Evaluation (Signed)
Anesthesia Post Note  Patient: Jaclyn Thornton  Procedure(s) Performed: Procedure(s) (LRB): LAPAROSCOPIC BILATERAL SALPINGECTOMY (Bilateral)  Patient location during evaluation: PACU Anesthesia Type: General Level of consciousness: awake Pain management: pain level controlled Vital Signs Assessment: post-procedure vital signs reviewed and stable Respiratory status: spontaneous breathing and respiratory function stable Cardiovascular status: stable Postop Assessment: no signs of nausea or vomiting Anesthetic complications: no    Last Vitals:  Filed Vitals:   07/23/15 1450 07/23/15 1455  BP: 109/76 113/73  Pulse:    Temp:    Resp: 18 27    Last Pain: There were no vitals filed for this visit.               Karey Stucki A

## 2015-07-23 NOTE — Anesthesia Preprocedure Evaluation (Signed)
Anesthesia Evaluation  Patient identified by MRN, date of birth, ID band Patient awake    Reviewed: Allergy & Precautions, NPO status , Patient's Chart, lab work & pertinent test results  Airway Mallampati: II  TM Distance: >3 FB     Dental  (+) Teeth Intact, Dental Advisory Given   Pulmonary neg pulmonary ROS,    breath sounds clear to auscultation       Cardiovascular negative cardio ROS   Rhythm:Regular Rate:Normal     Neuro/Psych  Headaches,    GI/Hepatic GERD  ,  Endo/Other    Renal/GU      Musculoskeletal   Abdominal   Peds  Hematology   Anesthesia Other Findings   Reproductive/Obstetrics                             Anesthesia Physical Anesthesia Plan  ASA: II  Anesthesia Plan: General   Post-op Pain Management:    Induction: Intravenous, Rapid sequence and Cricoid pressure planned  Airway Management Planned: Oral ETT  Additional Equipment:   Intra-op Plan:   Post-operative Plan: Extubation in OR  Informed Consent: I have reviewed the patients History and Physical, chart, labs and discussed the procedure including the risks, benefits and alternatives for the proposed anesthesia with the patient or authorized representative who has indicated his/her understanding and acceptance.     Plan Discussed with:   Anesthesia Plan Comments:         Anesthesia Quick Evaluation

## 2015-07-23 NOTE — Anesthesia Procedure Notes (Signed)
Procedure Name: Intubation Date/Time: 07/23/2015 3:10 PM Performed by: Franco NonesYATES, Zulema Pulaski S Pre-anesthesia Checklist: Patient identified, Patient being monitored, Timeout performed, Emergency Drugs available and Suction available Patient Re-evaluated:Patient Re-evaluated prior to inductionOxygen Delivery Method: Circle System Utilized Preoxygenation: Pre-oxygenation with 100% oxygen Intubation Type: IV induction and Cricoid Pressure applied Ventilation: Mask ventilation without difficulty Laryngoscope Size: Miller and 2 Grade View: Grade I Tube type: Oral Tube size: 7.0 mm Number of attempts: 1 Airway Equipment and Method: Stylet and Oral airway Placement Confirmation: ETT inserted through vocal cords under direct vision,  positive ETCO2 and breath sounds checked- equal and bilateral Secured at: 21 cm Tube secured with: Tape Dental Injury: Teeth and Oropharynx as per pre-operative assessment

## 2015-07-24 LAB — TYPE AND SCREEN
ABO/RH(D): A POS
ANTIBODY SCREEN: NEGATIVE
UNIT DIVISION: 0
Unit division: 0

## 2015-07-26 ENCOUNTER — Encounter (HOSPITAL_COMMUNITY): Payer: Self-pay | Admitting: Obstetrics and Gynecology

## 2015-08-02 ENCOUNTER — Encounter: Payer: Self-pay | Admitting: Obstetrics and Gynecology

## 2015-08-02 ENCOUNTER — Ambulatory Visit (INDEPENDENT_AMBULATORY_CARE_PROVIDER_SITE_OTHER): Payer: Medicaid Other | Admitting: Obstetrics and Gynecology

## 2015-08-02 ENCOUNTER — Telehealth: Payer: Self-pay | Admitting: Obstetrics and Gynecology

## 2015-08-02 VITALS — BP 140/90 | HR 66 | Ht 66.0 in | Wt 195.5 lb

## 2015-08-02 DIAGNOSIS — Z862 Personal history of diseases of the blood and blood-forming organs and certain disorders involving the immune mechanism: Secondary | ICD-10-CM

## 2015-08-02 DIAGNOSIS — Z3202 Encounter for pregnancy test, result negative: Secondary | ICD-10-CM | POA: Diagnosis not present

## 2015-08-02 DIAGNOSIS — Z8759 Personal history of other complications of pregnancy, childbirth and the puerperium: Secondary | ICD-10-CM

## 2015-08-02 DIAGNOSIS — Z9889 Other specified postprocedural states: Secondary | ICD-10-CM | POA: Diagnosis not present

## 2015-08-02 LAB — POCT HEMOGLOBIN: HEMOGLOBIN: 9.3 g/dL — AB (ref 12.2–16.2)

## 2015-08-02 LAB — POCT URINE PREGNANCY: PREG TEST UR: NEGATIVE

## 2015-08-02 NOTE — Telephone Encounter (Signed)
Pt called to move time of appt up to 9:30. Pt has not had a menstrual period since surgery. Will check urine preg test, and if positive, will draw quantitative serum hcg. The absence of a menses since procedure raises the question of residual ectopic tissue, possibly involving ovary. Will see pt at 9:30

## 2015-08-03 DIAGNOSIS — Z8759 Personal history of other complications of pregnancy, childbirth and the puerperium: Secondary | ICD-10-CM | POA: Insufficient documentation

## 2015-08-03 NOTE — Progress Notes (Signed)
Patient ID: Jaclyn Thornton, female   DOB: 1981/01/27, 35 y.o.   MRN: [id number]    Subjective:  Jaclyn Thornton is a 35 y.o. female now 1 weeks status post bilateral salpingectomy for left ectopic.    pt has not had a withdrawal period Review of Systems Negative except no menses yet   Diet:   reg   Bowel movements : normal.  The patient is not having any pain.  Objective:  BP 140/90 mmHg  Pulse 66  Ht  (1.676 m)  Wt 195 lb 8 oz (88.678 kg)  BMI 31.57 kg/m2  LMP 05/26/2015 (Exact Date) General:Well developed, well nourished.  No acute distress. Abdomen: Bowel sounds normal, soft, non-tender. Pelvic Exam:    External Genitalia:  Normal.    Vagina: Normal    Cervix: Normal    Uterus: Normal    Adnexa/Bimanual: Normal  Incision(s):   Healing well, no drainage, no erythema, no hernia, no swelling, no dehiscence,    urine preg test   NEGATIVE   Assessment:  Post-Op 1 weeks s/p laparoscopy   S.P BILAT SALPINGECTOMYFOR ECTOPIC  Doing WELL postoperatively.   Plan:  1.Wound care discussed   2. . current medications.none 3. Activity restrictions: none 4. return to work: now. 5. Follow up in prn .

## 2016-01-10 ENCOUNTER — Encounter: Payer: Self-pay | Admitting: Obstetrics and Gynecology

## 2016-06-18 ENCOUNTER — Encounter: Payer: Self-pay | Admitting: Obstetrics and Gynecology

## 2016-06-19 ENCOUNTER — Telehealth: Payer: Self-pay | Admitting: *Deleted

## 2016-06-19 NOTE — Telephone Encounter (Signed)
Pt requesting Dr.Ferguson to complete FMLA forms for migraines related to menses. Pt informed since Dr.Ferguson has not seen her for this problem she will need an appt to discuss with Dr.Ferguson before FMLA completed. Pt given an appt for 06/28/2016.

## 2016-06-28 ENCOUNTER — Ambulatory Visit: Payer: Self-pay | Admitting: Obstetrics and Gynecology

## 2016-06-29 ENCOUNTER — Encounter: Payer: Self-pay | Admitting: Advanced Practice Midwife

## 2016-06-29 ENCOUNTER — Ambulatory Visit (INDEPENDENT_AMBULATORY_CARE_PROVIDER_SITE_OTHER): Payer: Self-pay | Admitting: Advanced Practice Midwife

## 2016-06-29 VITALS — BP 128/100 | HR 76 | Ht 65.0 in | Wt 207.0 lb

## 2016-06-29 DIAGNOSIS — Z029 Encounter for administrative examinations, unspecified: Secondary | ICD-10-CM

## 2016-06-29 DIAGNOSIS — D649 Anemia, unspecified: Secondary | ICD-10-CM

## 2016-06-29 DIAGNOSIS — G43839 Menstrual migraine, intractable, without status migrainosus: Secondary | ICD-10-CM | POA: Insufficient documentation

## 2016-06-29 LAB — POCT HEMOGLOBIN: Hemoglobin: 11.7 g/dL — AB (ref 12.2–16.2)

## 2016-06-29 MED ORDER — ALBUTEROL SULFATE HFA 108 (90 BASE) MCG/ACT IN AERS: 2.0000 | INHALATION_SPRAY | Freq: Four times a day (QID) | RESPIRATORY_TRACT | 2 refills | Status: DC | PRN

## 2016-06-29 MED ORDER — LEVONORGEST-ETH ESTRAD 91-DAY 0.15-0.03 &0.01 MG PO TABS: 1.0000 | ORAL_TABLET | Freq: Every day | ORAL | 4 refills | Status: DC

## 2016-06-29 NOTE — Progress Notes (Signed)
[facility] Visit  Patient name: Jaclyn Thornton MRN [id number]  Date of birth: 06-Dec-1980  CC & HPI:  Jaclyn Thornton is a 35 y.o.  female presenting today for c/o migraine HA with every period.  She has sufferred for years with this.  Has missed 2-3 days of work every month.  Worried she may lose her job.  Needs FMLA papers filled out.  Had BTL earlier this year.  Gets HA on left temple the day before her period starts and it continues for 2-3 days.  Has felt SOB w/mild exertion for a few months. Had an inhaler as a child, wants to try that.  No other asso sx.  Has long hx of anemia, has been on iron for a few months.   Pertinent History Reviewed:  Medical & Surgical Hx:   Past Medical History:  Diagnosis Date  . Anemia   . Ectopic pregnancy 2016  . Migraines    Past Surgical History:  Procedure Laterality Date  . LAPAROSCOPIC BILATERAL SALPINGECTOMY Bilateral 07/23/2015   Procedure: LAPAROSCOPIC BILATERAL SALPINGECTOMY;  Surgeon: Tilda BurrowJohn Ferguson V, MD;  Location: [facility];  Service: Gynecology;  Laterality: Bilateral;  . TUBAL LIGATION  2001   Family History  Problem Relation Age of Onset  . Breast cancer Maternal Aunt   . Ovarian cancer Maternal Aunt     Current Outpatient Prescriptions:  .  acetaminophen (TYLENOL) 500 MG tablet, Take 500 mg by mouth every 6 (six) hours as needed for headache. , Disp: , Rfl:  .  Aspirin-Salicylamide-Caffeine (BC HEADACHE POWDER PO), Take by mouth., Disp: , Rfl:  .  albuterol (PROVENTIL HFA;VENTOLIN HFA) 108 (90 Base) MCG/ACT inhaler, Inhale 2 puffs into the lungs every 6 (six) hours as needed for wheezing or shortness of breath., Disp: 1 Inhaler, Rfl: 2 .  Levonorgestrel-Ethinyl Estradiol (AMETHIA,CAMRESE) 0.15-0.03 &0.01 MG tablet, Take 1 tablet by mouth daily., Disp: 1 Package, Rfl: 4 .  oxyCODONE-acetaminophen (PERCOCET/ROXICET) 5-325 MG tablet, Take 1 tablet by mouth every 4 (four) hours as needed. (Patient not taking: Reported on  08/02/2015), Disp: 20 tablet, Rfl: 0 Social History: Reviewed -  reports that she has never smoked. She has never used smokeless tobacco.  Review of Systems:   Constitutional: Negative for fever and chills Eyes: Negative for visual disturbances Respiratory: Negative for shortness of breath, dyspnea Cardiovascular: Negative for chest pain or palpitations  Gastrointestinal: Negative for vomiting, diarrhea and constipation; no abdominal pain Genitourinary: Negative for dysuria and urgency, vaginal irritation or itching Musculoskeletal: Negative for back pain, joint pain, myalgias  Neurological: Negative for dizziness   Objective Findings:    Physical Examination: General appearance - well appearing, and in no distress Mental status - alert, oriented to person, place, and time Chest:  Normal respiratory effort Heart - normal rate and regular rhythm Abdomen:  Soft, nontender Musculoskeletal:  Normal range of motion without pain Extremities:  No edema    Results for orders placed or performed in visit on 06/29/16 (from the past 24 hour(s))  POCT hemoglobin   Collection Time: 06/29/16 11:14 AM  Result Value Ref Range   Hemoglobin 11.7 (A) 12.2 - 16.2 g/dL      Assessment & Plan:  A:   Menstrual migraines.  Options discussed (continuous BC vs estrogen patch w/menses.)  Will try COCs  Anemia, very mild  SOB, ? astham P:  Meds ordered this encounter  Medications  . Aspirin-Salicylamide-Caffeine (BC HEADACHE POWDER PO)    Sig: Take by mouth.  .Marland Kitchen  Levonorgestrel-Ethinyl Estradiol (AMETHIA,CAMRESE) 0.15-0.03 &0.01 MG tablet    Sig: Take 1 tablet by mouth daily.    Dispense:  1 Package    Refill:  4    Order Specific Question:   Supervising Provider    Answer:   Despina HiddenEURE, LUTHER H [[id number]]  . albuterol (PROVENTIL HFA;VENTOLIN HFA) 108 (90 Base) MCG/ACT inhaler    Sig: Inhale 2 puffs into the lungs every 6 (six) hours as needed for wheezing or shortness of breath.    Dispense:  1  Inhaler    Refill:  2    Order Specific Question:   Supervising Provider    Answer:   Lazaro ArmsEURE, LUTHER H [[id number]]     Start Seasonique: if no improvement, consider estrogen patch  Continue FeSO4  If inhaler doesn't help, see PCP regarding SOB  Return if symptoms worsen or fail to improve.  CRESENZO-DISHMAN,Debie Ashline CNM 06/29/2016 2:10 PM

## 2017-01-05 IMAGING — US US OB TRANSVAGINAL
1 series · 14 of 28 positions shown · non-contrast
Comparison: None.

CLINICAL DATA: Pelvic pain for 1 week

EXAM:
OBSTETRIC <14 WK US AND TRANSVAGINAL OB US
TECHNIQUE: Both transabdominal and transvaginal ultrasound examinations were
performed for complete evaluation of the gestation as well as the
maternal uterus, adnexal regions, and pelvic cul-de-sac.
Transvaginal technique was performed to assess early pregnancy.

[Series 1: us ob transvaginal · 0.28mm/px · 115 acquisitions, 14 frames shown]
[im 5/115]
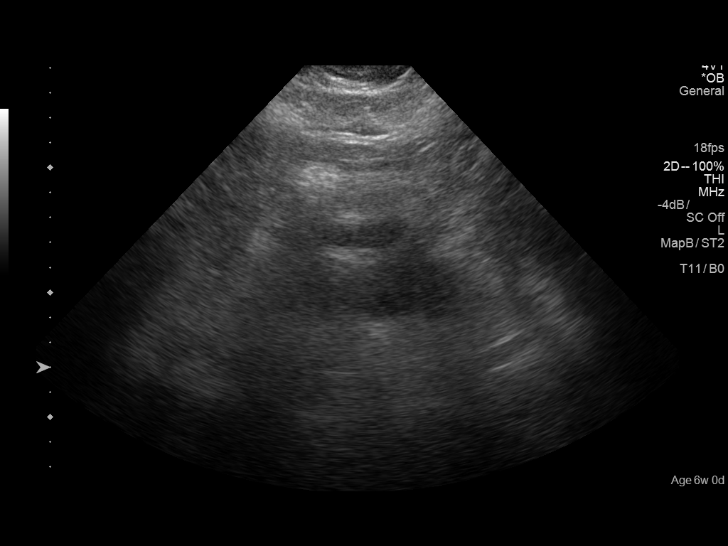
[im 13/115]
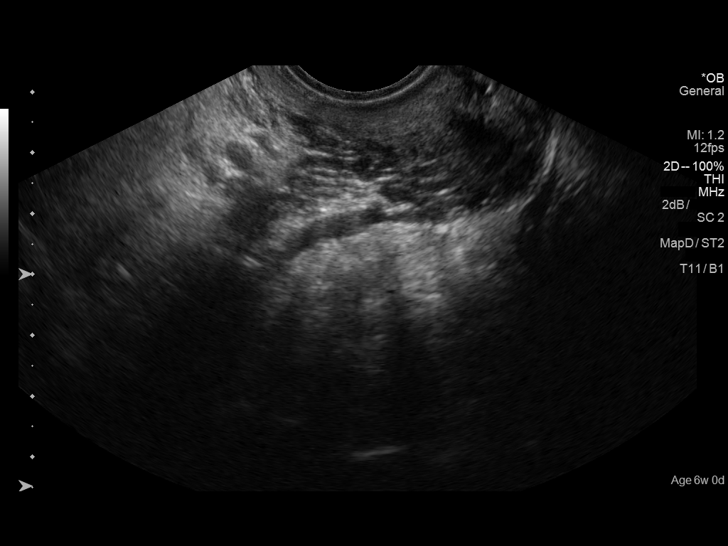
[im 22/115]
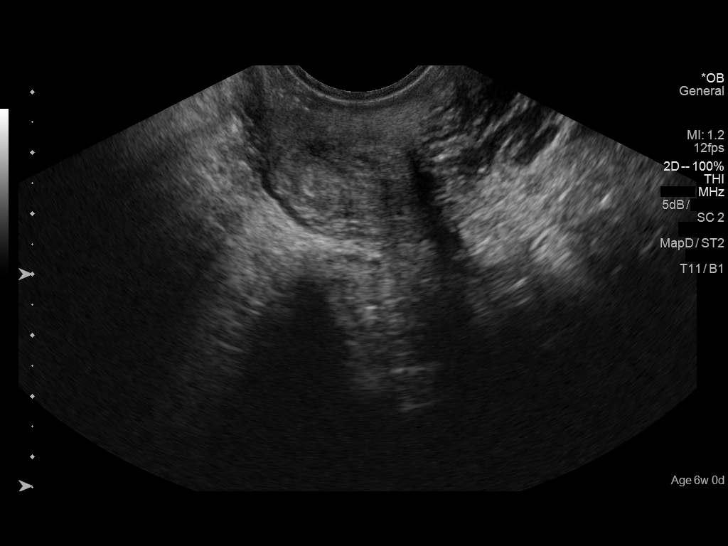
[im 30/115]
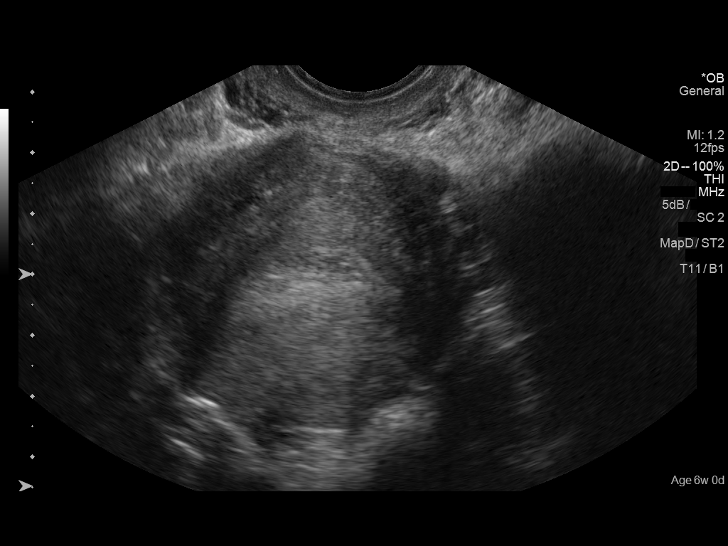
[im 39/115]
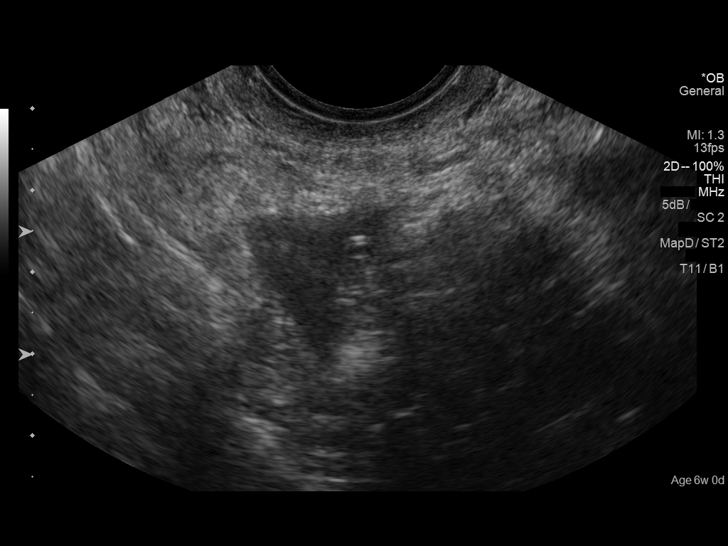
[im 47/115]
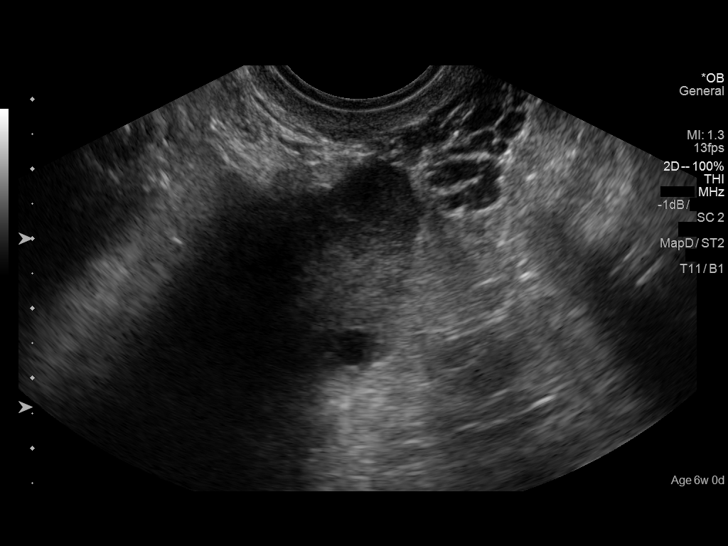
[im 55/115]
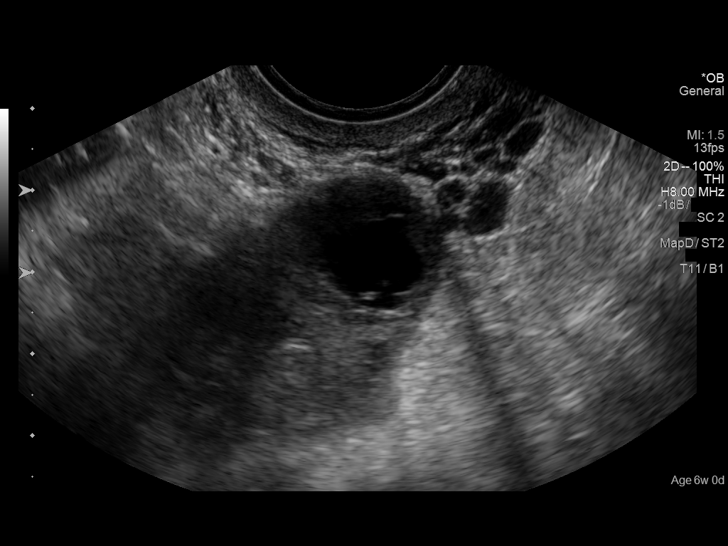
[im 64/115]
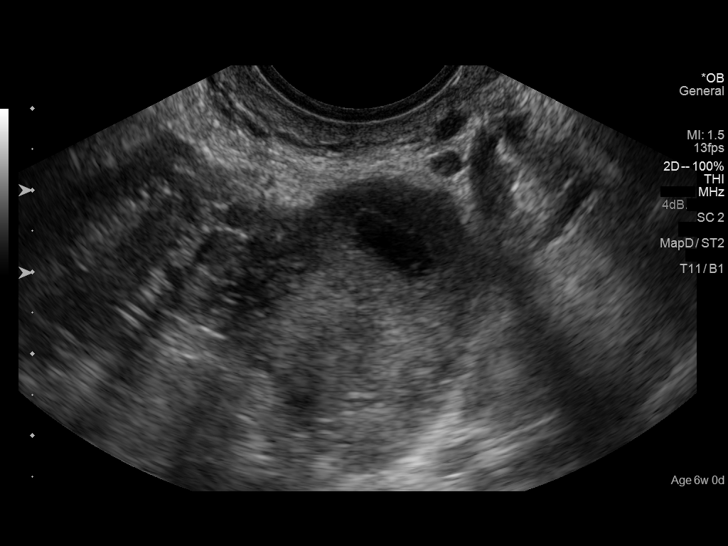
[im 72/115]
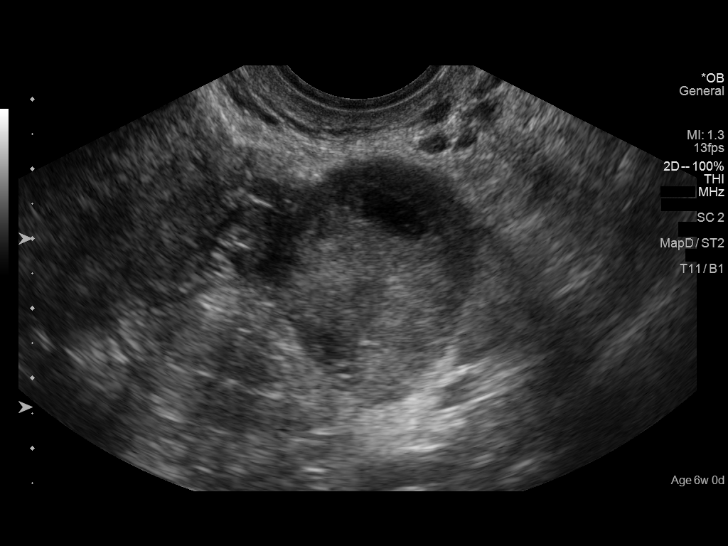
[im 81/115]
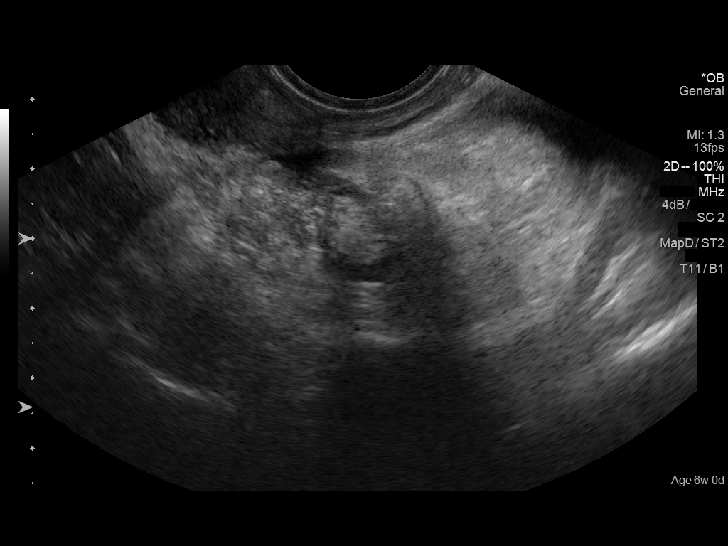
[im 89/115]
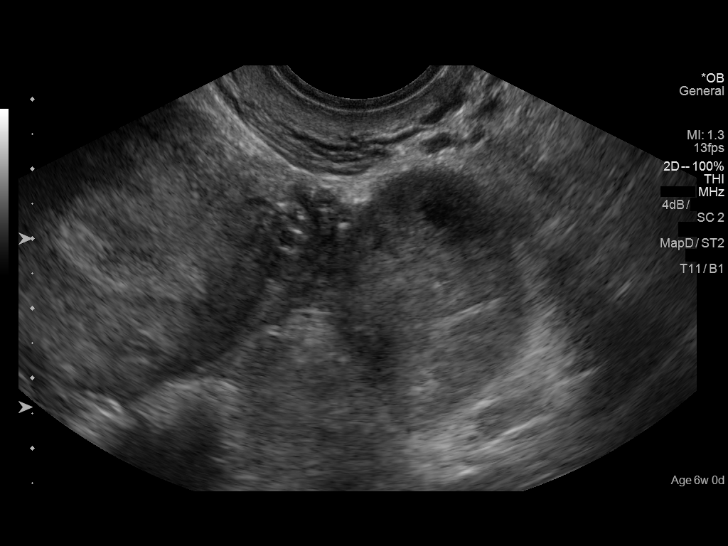
[im 98/115]
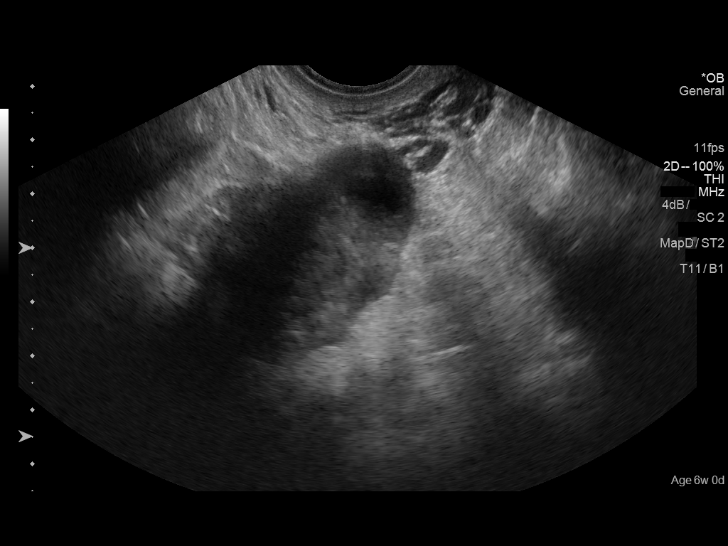
[im 106/115]
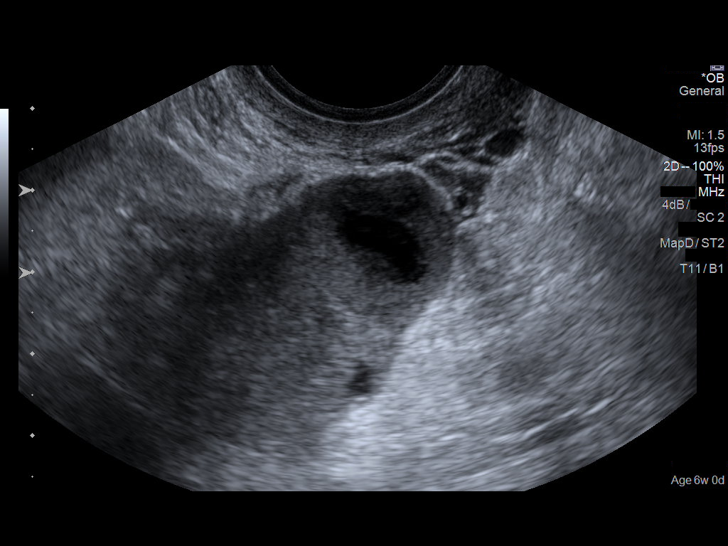
[im 115/115]
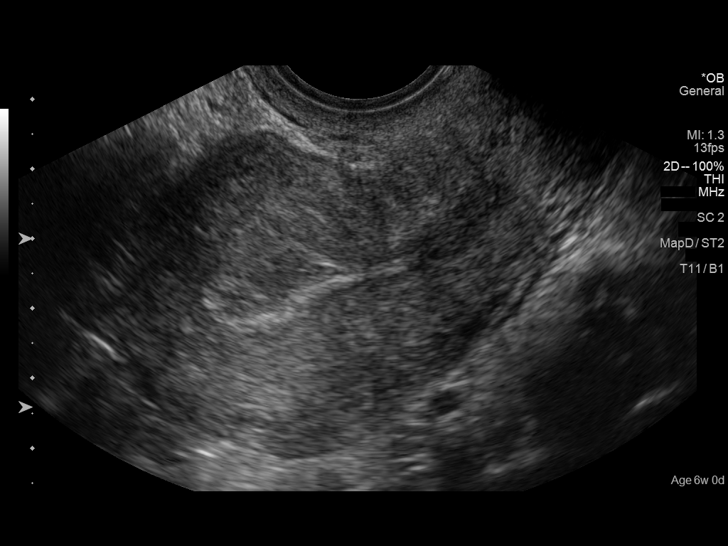

[14 of 28 positions shown; findings below may reference images not displayed]

FINDINGS: Intrauterine gestational sac: No gestational sac is identified.

Maternal uterus/adnexae: The uterus is within normal limits. A
complex cystic lesion is noted in the left adnexa. The right ovary
is within normal limits. A minimal amount of free pelvic fluid is
seen.
IMPRESSION: No definitive intrauterine or extrauterine gestational sac is seen.

Complex cystic lesion likely representing hemorrhagic cysts in the
left ovary. Correlation with serial beta HCG level is recommended.
Followup examination is recommended as clinically indicated.

## 2017-05-31 ENCOUNTER — Encounter: Payer: Self-pay | Admitting: Advanced Practice Midwife

## 2018-05-28 ENCOUNTER — Telehealth: Payer: Self-pay | Admitting: Advanced Practice Midwife

## 2018-05-28 NOTE — Telephone Encounter (Signed)
Please call pt she is wanting to get some kind of form from you that you fill out each year for her

## 2018-06-12 DIAGNOSIS — Z029 Encounter for administrative examinations, unspecified: Secondary | ICD-10-CM

## 2019-01-06 ENCOUNTER — Encounter: Payer: Self-pay | Admitting: *Deleted

## 2019-01-22 ENCOUNTER — Encounter: Payer: Self-pay | Admitting: *Deleted

## 2019-01-24 ENCOUNTER — Telehealth: Payer: Self-pay | Admitting: *Deleted

## 2019-01-24 NOTE — Telephone Encounter (Signed)
Patient called stating she wants to schedule a vertical visit with Manus Gunning, for her migraines and FMLA. Patient also wants to schedule an appointment for a pap. Please advise [phone]

## 2019-02-06 ENCOUNTER — Ambulatory Visit (INDEPENDENT_AMBULATORY_CARE_PROVIDER_SITE_OTHER): Payer: Medicaid Other | Admitting: Advanced Practice Midwife

## 2019-02-06 ENCOUNTER — Other Ambulatory Visit: Payer: Self-pay

## 2019-02-06 ENCOUNTER — Encounter: Payer: Self-pay | Admitting: Advanced Practice Midwife

## 2019-02-06 ENCOUNTER — Other Ambulatory Visit (HOSPITAL_COMMUNITY)
Admission: RE | Admit: 2019-02-06 | Discharge: 2019-02-06 | Disposition: A | Discharge status: Home / Self Care | Payer: Medicaid Other | Source: Ambulatory Visit | Attending: Advanced Practice Midwife | Admitting: Advanced Practice Midwife

## 2019-02-06 VITALS — BP 130/82 | HR 90 | Ht 65.0 in | Wt 200.5 lb

## 2019-02-06 DIAGNOSIS — Z01419 Encounter for gynecological examination (general) (routine) without abnormal findings: Secondary | ICD-10-CM | POA: Diagnosis present

## 2019-02-06 DIAGNOSIS — G43839 Menstrual migraine, intractable, without status migrainosus: Secondary | ICD-10-CM | POA: Diagnosis not present

## 2019-02-06 NOTE — Progress Notes (Signed)
Jaclyn Thornton 38 y.o.  Vitals:   02/06/19 0930 02/06/19 0935  BP: (!) 141/97 130/82  Pulse: 83 90  130/90   Filed Weights   02/06/19 0930  Weight: 200 lb 8 oz (90.9 kg)    Past Medical History: Past Medical History:  Diagnosis Date  . Anemia   . Ectopic pregnancy 2016  . Migraines     Past Surgical History: Past Surgical History:  Procedure Laterality Date  . LAPAROSCOPIC BILATERAL SALPINGECTOMY Bilateral 07/23/2015   Procedure: LAPAROSCOPIC BILATERAL SALPINGECTOMY;  Surgeon: Jonnie Kind, MD;  Location: [facility];  Service: Gynecology;  Laterality: Bilateral;  . TUBAL LIGATION  2001    Family History: Family History  Problem Relation Age of Onset  . Breast cancer Maternal Aunt   . Ovarian cancer Maternal Aunt     Social History: Social History   Tobacco Use  . Smoking status: Never Smoker  . Smokeless tobacco: Never Used  Substance Use Topics  . Alcohol use: Yes    Alcohol/week: 0.0 standard drinks    Comment: occ  . Drug use: No    Allergies: No Known Allergies    Current Outpatient Medications:  .  acetaminophen (TYLENOL) 500 MG tablet, Take 500 mg by mouth every 6 (six) hours as needed for headache. , Disp: , Rfl:  .  albuterol (PROVENTIL HFA;VENTOLIN HFA) 108 (90 Base) MCG/ACT inhaler, Inhale 2 puffs into the lungs every 6 (six) hours as needed for wheezing or shortness of breath., Disp: 1 Inhaler, Rfl: 2 .  Aspirin-Salicylamide-Caffeine (BC HEADACHE POWDER PO), Take by mouth., Disp: , Rfl:   History of Present Illness: Here for pap and FMLA papers for menstrual migraines. last visit in 2017. Was rx'd COCs but didn't feel like they helped, so not on any meds.    Review of Systems   Patient denies blurred vision, shortness of breath, chest pain, abdominal pain, problems with bowel movements, urination, or intercourse.   Physical Exam: General:  Well developed, well nourished, no acute distress Skin:  Warm and dry Neck:  Midline  trachea Lungs;normal respiratory effort Cardiovascular: Regular rate and rhythm Abdomen:  Soft, non tender Pelvic:  External genitalia is normal in appearance.  The vagina is normal in appearance.  The cervix is bulbous.  Uterus is felt to be normal size, shape, and contour.  No adnexal masses or tenderness noted. Exam limited by habitus Extremities:  No swelling or varicosities noted Psych:  No mood changes.     Impression: normal GYN exam Menstrual migraines     Plan: Divigel 0.5% daily and immitrex 100mg  BID: start both the day before menses  If pap normal, repeat q 3 years

## 2019-02-07 LAB — CYTOLOGY - PAP
Chlamydia: NEGATIVE
Diagnosis: NEGATIVE
HPV: NOT DETECTED
Neisseria Gonorrhea: NEGATIVE

## 2019-02-17 ENCOUNTER — Encounter: Payer: Self-pay | Admitting: Advanced Practice Midwife

## 2019-02-17 ENCOUNTER — Telehealth: Payer: Self-pay | Admitting: Women's Health

## 2019-02-17 NOTE — Telephone Encounter (Signed)
Patient called stating that she has a question regarding her FMLA, pt states that her employer called and states it is not completed, it is missing a start and end date. Please contact pt

## 2019-06-30 ENCOUNTER — Other Ambulatory Visit: Payer: Self-pay

## 2019-06-30 ENCOUNTER — Ambulatory Visit (INDEPENDENT_AMBULATORY_CARE_PROVIDER_SITE_OTHER): Payer: Self-pay | Admitting: Advanced Practice Midwife

## 2019-06-30 ENCOUNTER — Encounter: Payer: Self-pay | Admitting: Advanced Practice Midwife

## 2019-06-30 VITALS — BP 138/90 | HR 79 | Ht 67.0 in | Wt 200.0 lb

## 2019-06-30 DIAGNOSIS — G43839 Menstrual migraine, intractable, without status migrainosus: Secondary | ICD-10-CM

## 2019-06-30 MED ORDER — LEVONORGEST-ETH ESTRAD 91-DAY 0.15-0.03 &0.01 MG PO TABS: 1.0000 | ORAL_TABLET | Freq: Every day | ORAL | 4 refills | Status: DC

## 2019-06-30 NOTE — Progress Notes (Signed)
[place] [facility] Visit  Patient name: Jaclyn Thornton MRN [id number]  Date of birth: [birth year]  CC & HP I:  Jaclyn Thornton is a 38 y.o.  female presenting today for menstrual migraines.Has long hx of HA during menses.(unilateral HA on first 2 days of period).    Was prescribed continuous COCs, but didn't feel like that helped, so stopped  Upon further investigation, it seems as if she did not take them continuously,  Remembers having a monthly period that lasted 3-5 days. Most recently seen in July, prescribed Divigil 0.5% and immitrex to start one day prior to and continue through menses.   Pertinent History Reviewed:  Medical & Surgical Hx:   Past Medical History:  Diagnosis Date  . Anemia   . Ectopic pregnancy 2016  . Migraines    Past Surgical History:  Procedure Laterality Date  . LAPAROSCOPIC BILATERAL SALPINGECTOMY Bilateral 07/23/2015   Procedure: LAPAROSCOPIC BILATERAL SALPINGECTOMY;  Surgeon: Jonnie Kind, MD;  Location: [facility];  Service: Gynecology;  Laterality: Bilateral;  . TUBAL LIGATION  2001   Family History  Problem Relation Age of Onset  . Breast cancer Maternal Aunt   . Ovarian cancer Maternal Aunt   . Dementia Paternal Grandmother   . Diabetes Father   . Hypertension Father   . Diabetes Mother   . Hypertension Mother   . Diabetes Brother     Current Outpatient Medications:  .  acetaminophen (TYLENOL) 500 MG tablet, Take 500 mg by mouth every 6 (six) hours as needed for headache. , Disp: , Rfl:  .  Aspirin-Salicylamide-Caffeine (BC HEADACHE POWDER PO), Take by mouth., Disp: , Rfl:  .  diphenhydrAMINE HCl (BENADRYL PO), Take by mouth as needed., Disp: , Rfl:  Social History: Reviewed -  reports that she has never smoked. She has never used smokeless tobacco.  Review of Systems:   Constitutional: Negative for fever and chills Eyes: Negative for visual disturbances Respiratory: Negative for shortness of breath, dyspnea Cardiovascular: Negative for  chest pain or palpitations  Gastrointestinal: Negative for vomiting, diarrhea and constipation; no abdominal pain Genitourinary: Negative for dysuria and urgency, vaginal irritation or itching Musculoskeletal: Negative for back pain, joint pain, myalgias  Neurological: Negative for dizziness    Objective Findings:    Physical Examination: Vitals:   06/30/19 1447  BP: 138/90  Pulse: 79   General appearance - well appearing, and in no distress Mental status - alert, oriented to person, place, and time Chest:  Normal respiratory effort Heart - normal rate and regular rhythm Abdomen:  Soft, nontender Pelvic: deferred Musculoskeletal:  Normal range of motion without pain Extremities:  No edema    No results found for this or any previous visit (from the past 24 hour(s)).    Assessment & Plan:  A:   Menstrual migraines P:  Start seasonale today.  When she gets to last 2 weeks, call me and tell me how her BTB has been.  If it's been minimal, will go straight to a new pack and skip placebos. If not, will take 4 placebos and add estrogen patch sample during this time.  Has [place] medicaid,  If no relief from HA, consider referral to [facility] to see Santiago Glad    No follow-ups on file.  Joaquim Lai Cresenzo-Dishmon CNM 06/30/2019 3:20 PM

## 2019-07-01 ENCOUNTER — Encounter: Payer: Self-pay | Admitting: Advanced Practice Midwife

## 2019-07-07 ENCOUNTER — Encounter: Payer: Self-pay | Admitting: Advanced Practice Midwife

## 2020-01-21 DIAGNOSIS — Z029 Encounter for administrative examinations, unspecified: Secondary | ICD-10-CM

## 2020-02-12 ENCOUNTER — Encounter: Payer: Self-pay | Admitting: Advanced Practice Midwife

## 2020-02-12 ENCOUNTER — Other Ambulatory Visit (HOSPITAL_COMMUNITY)
Admission: RE | Admit: 2020-02-12 | Discharge: 2020-02-12 | Disposition: A | Discharge status: Home / Self Care | Payer: Medicaid Other | Source: Ambulatory Visit | Attending: Advanced Practice Midwife | Admitting: Advanced Practice Midwife

## 2020-02-12 ENCOUNTER — Ambulatory Visit (INDEPENDENT_AMBULATORY_CARE_PROVIDER_SITE_OTHER): Payer: Medicaid Other | Admitting: Advanced Practice Midwife

## 2020-02-12 VITALS — BP 136/85 | HR 79 | Ht 67.0 in | Wt 200.0 lb

## 2020-02-12 DIAGNOSIS — Z113 Encounter for screening for infections with a predominantly sexual mode of transmission: Secondary | ICD-10-CM

## 2020-02-12 DIAGNOSIS — Z01419 Encounter for gynecological examination (general) (routine) without abnormal findings: Secondary | ICD-10-CM | POA: Insufficient documentation

## 2020-02-12 NOTE — Progress Notes (Signed)
Jaclyn Thornton 39 y.o.  Vitals:   02/12/20 0843  BP: (!) 136/85  Pulse: 79     Filed Weights   02/12/20 0843  Weight: 200 lb (90.7 kg)    Past Medical History: Past Medical History:  Diagnosis Date  . Anemia   . Ectopic pregnancy 2016  . Migraines     Past Surgical History: Past Surgical History:  Procedure Laterality Date  . DILATION AND CURETTAGE OF UTERUS    . LAPAROSCOPIC BILATERAL SALPINGECTOMY Bilateral 07/23/2015   Procedure: LAPAROSCOPIC BILATERAL SALPINGECTOMY;  Surgeon: Tilda Burrow, MD;  Location: [facility];  Service: Gynecology;  Laterality: Bilateral;  . TUBAL LIGATION  2001    Family History: Family History  Problem Relation Age of Onset  . Breast cancer Maternal Aunt   . Ovarian cancer Maternal Aunt   . Dementia Paternal Grandmother   . Diabetes Father   . Hypertension Father   . Diabetes Mother   . Hypertension Mother   . Diabetes Brother     Social History: Social History   Tobacco Use  . Smoking status: Never Smoker  . Smokeless tobacco: Never Used  Vaping Use  . Vaping Use: Never used  Substance Use Topics  . Alcohol use: Yes    Alcohol/week: 0.0 standard drinks    Comment: occ  . Drug use: No    Allergies: No Known Allergies    Current Outpatient Medications:  .  acetaminophen (TYLENOL) 500 MG tablet, Take 500 mg by mouth every 6 (six) hours as needed for headache. , Disp: , Rfl:  .  Aspirin-Salicylamide-Caffeine (BC HEADACHE POWDER PO), Take by mouth., Disp: , Rfl:  .  diphenhydrAMINE HCl (BENADRYL PO), Take by mouth as needed., Disp: , Rfl:  .  Levonorgestrel-Ethinyl Estradiol (SEASONIQUE) 0.15-0.03 &0.01 MG tablet, Take 1 tablet by mouth daily. (Patient not taking: Reported on 02/12/2020), Disp: 1 Package, Rfl: 4  History of Present Illness: Here for physical. Pap 2020 was normal.  Has always had what sounds like a true menstrual migraine.  Tried COC for a few months (Seasonale)  Had spotting and still had HA, despite being on  the active pills. May consider another opinion by a HA expert, not covered by [facility] medicaid, however.   Review of Systems   Patient denies any blurred vision, shortness of breath, chest pain, abdominal pain, problems with bowel movements, urination, or intercourse.   Physical Exam: General:  Well developed, well nourished, no acute distress Skin:  Warm and dry Neck:  Midline trachea, normal thyroid Lungs; Clear to auscultation bilaterally Breast:  No dominant palpable mass, retraction, or nipple discharge Cardiovascular: Regular rate and rhythm Abdomen:  Soft, non tender, no hepatosplenomegaly Pelvic:  External genitalia is normal in appearance.  The vagina is normal in appearance.  The cervix is bulbous.  Uterus is felt to be normal size, shape, and contour.  No adnexal masses or tenderness noted.  Extremities:  No swelling or varicosities noted Psych:  No mood changes.     Impression: normal GYN exam ? Menstrual migraines   Plan: pap 2023 Message sent to [facility] about the [facility] free mammogram

## 2020-02-12 NOTE — Addendum Note (Signed)
Addended by: Moss Mc on: 02/12/2020 12:12 PM   Modules accepted: Orders

## 2020-02-13 LAB — CERVICOVAGINAL ANCILLARY ONLY
Chlamydia: NEGATIVE
Comment: NEGATIVE
Comment: NEGATIVE
Comment: NORMAL
Neisseria Gonorrhea: NEGATIVE
Trichomonas: POSITIVE — AB

## 2020-02-17 ENCOUNTER — Telehealth: Payer: Self-pay

## 2020-02-17 NOTE — Telephone Encounter (Signed)
Telephoned patient at mobile number. Left a voice message with BCCCP contact information. 

## 2020-02-19 ENCOUNTER — Other Ambulatory Visit: Payer: Self-pay | Admitting: Advanced Practice Midwife

## 2020-02-19 MED ORDER — METRONIDAZOLE 500 MG PO TABS: 2000.0000 mg | ORAL_TABLET | Freq: Once | ORAL | 0 refills | Status: AC

## 2020-02-19 NOTE — Progress Notes (Signed)
Flagyl for trich

## 2020-04-19 ENCOUNTER — Other Ambulatory Visit: Payer: Self-pay | Admitting: Advanced Practice Midwife

## 2020-04-19 MED ORDER — METRONIDAZOLE 500 MG PO TABS: 500.0000 mg | ORAL_TABLET | Freq: Two times a day (BID) | ORAL | 0 refills | Status: DC

## 2020-04-19 NOTE — Progress Notes (Signed)
Has trich symptoms.  (see message).  Rx flagyl 500mg  BID # 14

## 2021-02-15 ENCOUNTER — Other Ambulatory Visit: Payer: Medicaid Other | Admitting: Obstetrics & Gynecology

## 2021-03-09 ENCOUNTER — Encounter: Payer: Self-pay | Admitting: Advanced Practice Midwife

## 2021-03-09 ENCOUNTER — Other Ambulatory Visit: Payer: Self-pay

## 2021-03-09 ENCOUNTER — Ambulatory Visit (INDEPENDENT_AMBULATORY_CARE_PROVIDER_SITE_OTHER): Payer: Medicaid Other | Admitting: Advanced Practice Midwife

## 2021-03-09 ENCOUNTER — Other Ambulatory Visit (HOSPITAL_COMMUNITY)
Admission: RE | Admit: 2021-03-09 | Discharge: 2021-03-09 | Disposition: A | Discharge status: Home / Self Care | Payer: Medicaid Other | Source: Ambulatory Visit | Attending: Obstetrics & Gynecology | Admitting: Obstetrics & Gynecology

## 2021-03-09 VITALS — BP 140/90 | HR 89 | Ht 67.0 in

## 2021-03-09 DIAGNOSIS — Z113 Encounter for screening for infections with a predominantly sexual mode of transmission: Secondary | ICD-10-CM

## 2021-03-09 DIAGNOSIS — Z01419 Encounter for gynecological examination (general) (routine) without abnormal findings: Secondary | ICD-10-CM | POA: Diagnosis not present

## 2021-03-09 DIAGNOSIS — Z3009 Encounter for other general counseling and advice on contraception: Secondary | ICD-10-CM | POA: Diagnosis present

## 2021-03-09 DIAGNOSIS — I10 Essential (primary) hypertension: Secondary | ICD-10-CM

## 2021-03-09 DIAGNOSIS — Z124 Encounter for screening for malignant neoplasm of cervix: Secondary | ICD-10-CM

## 2021-03-09 MED ORDER — AMLODIPINE BESYLATE 5 MG PO TABS: 5.0000 mg | ORAL_TABLET | Freq: Every day | ORAL | 11 refills | Status: DC

## 2021-03-09 NOTE — Patient Instructions (Signed)
Send me a Clinical cytogeneticist message next week with a few of your blood pressures  The mammogram people will call you to set something up.  Let me know if you don't hear from them in a week.

## 2021-03-09 NOTE — Progress Notes (Addendum)
Jaclyn Thornton 40 y.o.  Vitals:   03/09/21 0845  BP: 140/90  Pulse: 89    There were no vitals filed for this visit.  Past Medical History: Past Medical History:  Diagnosis Date   Anemia    Ectopic pregnancy 2016   Migraines     Past Surgical History: Past Surgical History:  Procedure Laterality Date   DILATION AND CURETTAGE OF UTERUS     LAPAROSCOPIC BILATERAL SALPINGECTOMY Bilateral 07/23/2015   Procedure: LAPAROSCOPIC BILATERAL SALPINGECTOMY;  Surgeon: Tilda Burrow, MD;  Location: [facility];  Service: Gynecology;  Laterality: Bilateral;   TUBAL LIGATION  2001    Family History: Family History  Problem Relation Age of Onset   Breast cancer Maternal Aunt    Ovarian cancer Maternal Aunt    Dementia Paternal Grandmother    Diabetes Father    Hypertension Father    Diabetes Mother    Hypertension Mother    Diabetes Brother     Social History: Social History   Tobacco Use   Smoking status: Never   Smokeless tobacco: Never  Vaping Use   Vaping Use: Never used  Substance Use Topics   Alcohol use: Yes    Alcohol/week: 0.0 standard drinks    Comment: occ   Drug use: No    Allergies: No Known Allergies    Current Outpatient Medications:    acetaminophen (TYLENOL) 500 MG tablet, Take 500 mg by mouth every 6 (six) hours as needed for headache. , Disp: , Rfl:    Aspirin-Salicylamide-Caffeine (BC HEADACHE POWDER PO), Take by mouth., Disp: , Rfl:    diphenhydrAMINE HCl (BENADRYL PO), Take by mouth as needed., Disp: , Rfl:   History of Present Illness: Here for physical.  Last pap 2020, normal.  Feels like may have IBS, has diarrhea a few times a week.  Still has HA first 2-3 days of menses.  Unwilling to use COCs d/t SE with first starting (irregular bleeding).  BPs for the past few years have been borderline high.  Had a bilateras salpingectomy for [facility].    Review of Systems   Patient denies any blurred vision, shortness of breath, chest pain, abdominal pain,  problems with bowel movements, urination, or intercourse.   Physical Exam: General:  Well developed, well nourished, no acute distress Skin:  Warm and dry Neck:  Midline trachea, normal thyroid Lungs; Clear to auscultation bilaterally Breast:  No dominant palpable mass, retraction, or nipple discharge Cardiovascular: Regular rate and rhythm Abdomen:  Soft, non tender, no hepatosplenomegaly Pelvic:  External genitalia is normal in appearance.  The vagina is normal in appearance.  The cervix is bulbous.  Uterus is felt to be normal size, shape, and contour.  No adnexal masses or tenderness noted.  Extremities:  No swelling or varicosities noted Psych:  No mood changes.     Impression/plan:  : Normal GYn exam HTN:  started on Norvasc 5mg , given BP cuff for home monitoring.  Will send me mchart message in a week w/BPs  ? Menstrual Migraines:  pt unwilling to try continuous BC d/t BTB; has tried estrogen (Divigil) prior to and during period w/o improvement.  Will refer to HA specialist when(if) she gets insurance  To apply for [facility] discount and reapply for medicaid (income has decreased) .  03/10/21 swab + BV. Rx flagyl

## 2021-03-10 DIAGNOSIS — Z029 Encounter for administrative examinations, unspecified: Secondary | ICD-10-CM

## 2021-03-10 LAB — CERVICOVAGINAL ANCILLARY ONLY
Bacterial Vaginitis (gardnerella): POSITIVE — AB
Candida Glabrata: NEGATIVE
Candida Vaginitis: NEGATIVE
Chlamydia: NEGATIVE
Comment: NEGATIVE
Comment: NEGATIVE
Comment: NEGATIVE
Comment: NEGATIVE
Comment: NEGATIVE
Comment: NORMAL
Neisseria Gonorrhea: NEGATIVE
Trichomonas: NEGATIVE

## 2021-03-10 MED ORDER — METRONIDAZOLE 500 MG PO TABS: 500.0000 mg | ORAL_TABLET | Freq: Two times a day (BID) | ORAL | 0 refills | Status: DC

## 2021-03-10 NOTE — Addendum Note (Signed)
Addended by: Jacklyn Shell on: 03/10/2021 07:50 PM   Modules accepted: Orders

## 2021-03-11 ENCOUNTER — Other Ambulatory Visit: Payer: Self-pay | Admitting: Adult Health

## 2021-03-22 ENCOUNTER — Other Ambulatory Visit: Payer: Self-pay | Admitting: *Deleted

## 2021-03-22 DIAGNOSIS — Z1231 Encounter for screening mammogram for malignant neoplasm of breast: Secondary | ICD-10-CM

## 2021-03-24 ENCOUNTER — Telehealth: Payer: Self-pay | Admitting: Obstetrics & Gynecology

## 2021-03-24 NOTE — Telephone Encounter (Signed)
Calling to check status of her FMLA forms  Please advise & notify pt

## 2021-04-25 ENCOUNTER — Telehealth: Payer: Self-pay

## 2021-04-25 NOTE — Telephone Encounter (Signed)
Telephoned patient at mobile number. Left voice message with [facility]CCCP contact information. 

## 2021-05-05 ENCOUNTER — Other Ambulatory Visit (HOSPITAL_COMMUNITY): Payer: Self-pay | Admitting: Obstetrics and Gynecology

## 2021-05-05 DIAGNOSIS — Z1231 Encounter for screening mammogram for malignant neoplasm of breast: Secondary | ICD-10-CM

## 2021-06-03 ENCOUNTER — Other Ambulatory Visit: Payer: Self-pay

## 2021-06-03 ENCOUNTER — Ambulatory Visit (HOSPITAL_COMMUNITY)
Admission: RE | Admit: 2021-06-03 | Discharge: 2021-06-03 | Disposition: A | Discharge status: Home / Self Care | Payer: Medicaid Other | Source: Ambulatory Visit | Attending: Obstetrics and Gynecology | Admitting: Obstetrics and Gynecology

## 2021-06-03 ENCOUNTER — Encounter (HOSPITAL_COMMUNITY): Payer: Self-pay

## 2021-06-03 ENCOUNTER — Inpatient Hospital Stay (HOSPITAL_COMMUNITY): Payer: Medicaid Other | Attending: Obstetrics and Gynecology | Admitting: *Deleted

## 2021-06-03 VITALS — BP 125/82 | Wt 187.0 lb

## 2021-06-03 DIAGNOSIS — Z1239 Encounter for other screening for malignant neoplasm of breast: Secondary | ICD-10-CM

## 2021-06-03 DIAGNOSIS — Z1231 Encounter for screening mammogram for malignant neoplasm of breast: Secondary | ICD-10-CM | POA: Insufficient documentation

## 2021-06-03 NOTE — Progress Notes (Signed)
Ms. CATHE BILGER is a 40 y.o. female who presents to [facility] clinic today with no complaints.    Pap Smear: Pap smear not completed today. Last Pap smear was 02/06/2019 at [facility] at [facility] clinic and was normal with negative HPV. Per patient has no history of an abnormal Pap smear. Last Pap smear result is available in Epic.   Physical exam: Breasts Breasts symmetrical. No skin abnormalities bilateral breasts. No nipple retraction bilateral breasts. No nipple discharge bilateral breasts. No lymphadenopathy. No lumps palpated bilateral breasts. No complaints of pain or tenderness on exam.     Pelvic/Bimanual Pap is not indicated today per BCCCP guidelines.   Smoking History: Patient has never smoked.   Patient Navigation: Patient education provided. Access to services provided for patient through BCCCP program.   Breast and Cervical Cancer Risk Assessment: Patient has family history of two paternal aunts and one maternal aunt having breast cancer. Patient has no known genetic mutations or history of radiation treatment to the chest before age 54. Patient does not have history of cervical dysplasia, immunocompromised, or DES exposure in-utero.  Risk Assessment     Risk Scores       06/03/2021   Last edited by: Priscille Heidelberg, RN   5-year risk: 0.4 %   Lifetime risk: 7.3 %           A: BCCCP exam without pap smear No complaints.  P: Referred patient to [facility] for a screening mammogram. Appointment scheduled Friday, June 03, 2021 at 1200.  Priscille Heidelberg, RN 06/03/2021 2:13 PM

## 2021-06-03 NOTE — Patient Instructions (Addendum)
Explained breast self awareness with Wilmer Floor. Patient did not need a Pap smear today due to last Pap smear and HPV typing was 02/06/2019. Let her know [facility]CP will cover Pap smears and HPV typing every 5 years unless has a history of abnormal Pap smears. Referred patient to [facility] for a screening mammogram. Appointment scheduled Friday, June 03, 2021 at 1200. Patient escorted to mammogram appointment following BCCCP appointment. Let patient know [facility] will follow up with her within the next couple weeks with results of her mammogram by letter or phone. Megann Corrinne Eagle verbalized understanding.  Felesia Stahlecker, Kathaleen Maser, RN 2:13 PM

## 2021-08-24 ENCOUNTER — Encounter: Payer: Self-pay | Admitting: Advanced Practice Midwife

## 2021-08-26 ENCOUNTER — Encounter: Payer: Self-pay | Admitting: Advanced Practice Midwife

## 2021-09-01 ENCOUNTER — Encounter: Payer: Self-pay | Admitting: Advanced Practice Midwife

## 2021-09-02 ENCOUNTER — Other Ambulatory Visit: Payer: Self-pay | Admitting: Adult Health

## 2021-09-02 ENCOUNTER — Telehealth: Payer: Self-pay | Admitting: Advanced Practice Midwife

## 2021-09-02 MED ORDER — FLUCONAZOLE 150 MG PO TABS: ORAL_TABLET | ORAL | 1 refills | Status: DC

## 2021-09-02 NOTE — Telephone Encounter (Signed)
Patient called stating that she has been sending messages through my chart regarding her FMLA paperwork, but has not heard anything back. I informed the patient that [employer]ren[employer] Freeze does not do FMLA but I will pass the message on to the nurse that does and she will give her a call back. Please contact pt

## 2021-09-02 NOTE — Progress Notes (Signed)
Will rx diflucan  

## 2021-09-05 NOTE — Telephone Encounter (Signed)
Communicated with patient via my chart

## 2021-09-12 ENCOUNTER — Encounter: Payer: Self-pay | Admitting: Advanced Practice Midwife

## 2021-11-15 ENCOUNTER — Encounter (HOSPITAL_COMMUNITY): Payer: Self-pay | Admitting: Emergency Medicine

## 2021-11-15 ENCOUNTER — Emergency Department (HOSPITAL_COMMUNITY)
Admission: EM | Admit: 2021-11-15 | Discharge: 2021-11-15 | Disposition: A | Discharge status: Home / Self Care | Payer: Self-pay | Attending: Emergency Medicine | Admitting: Emergency Medicine

## 2021-11-15 ENCOUNTER — Emergency Department (HOSPITAL_COMMUNITY): Payer: Self-pay

## 2021-11-15 ENCOUNTER — Other Ambulatory Visit: Payer: Self-pay

## 2021-11-15 DIAGNOSIS — Z79899 Other long term (current) drug therapy: Secondary | ICD-10-CM | POA: Insufficient documentation

## 2021-11-15 DIAGNOSIS — Z7982 Long term (current) use of aspirin: Secondary | ICD-10-CM | POA: Insufficient documentation

## 2021-11-15 DIAGNOSIS — R1013 Epigastric pain: Secondary | ICD-10-CM | POA: Insufficient documentation

## 2021-11-15 DIAGNOSIS — R1011 Right upper quadrant pain: Secondary | ICD-10-CM | POA: Insufficient documentation

## 2021-11-15 LAB — CBC WITH DIFFERENTIAL/PLATELET
Abs Immature Granulocytes: 0.03 10*3/uL (ref 0.00–0.07)
Basophils Absolute: 0 10*3/uL (ref 0.0–0.1)
Basophils Relative: 0 %
Eosinophils Absolute: 0.1 10*3/uL (ref 0.0–0.5)
Eosinophils Relative: 1 %
HCT: 39 % (ref 36.0–46.0)
Hemoglobin: 12.4 g/dL (ref 12.0–15.0)
Immature Granulocytes: 0 %
Lymphocytes Relative: 30 %
Lymphs Abs: 2.5 10*3/uL (ref 0.7–4.0)
MCH: 29 pg (ref 26.0–34.0)
MCHC: 31.8 g/dL (ref 30.0–36.0)
MCV: 91.3 fL (ref 80.0–100.0)
Monocytes Absolute: 0.6 10*3/uL (ref 0.1–1.0)
Monocytes Relative: 8 %
Neutro Abs: 5 10*3/uL (ref 1.7–7.7)
Neutrophils Relative %: 61 %
Platelets: 303 10*3/uL (ref 150–400)
RBC: 4.27 MIL/uL (ref 3.87–5.11)
RDW: 12.7 % (ref 11.5–15.5)
WBC: 8.4 10*3/uL (ref 4.0–10.5)
nRBC: 0 % (ref 0.0–0.2)

## 2021-11-15 LAB — URINALYSIS, ROUTINE W REFLEX MICROSCOPIC
Bilirubin Urine: NEGATIVE
Glucose, UA: NEGATIVE mg/dL
Hgb urine dipstick: NEGATIVE
Ketones, ur: NEGATIVE mg/dL
Leukocytes,Ua: NEGATIVE
Nitrite: NEGATIVE
Protein, ur: NEGATIVE mg/dL
Specific Gravity, Urine: 1.011 (ref 1.005–1.030)
pH: 7 (ref 5.0–8.0)

## 2021-11-15 LAB — COMPREHENSIVE METABOLIC PANEL
ALT: 25 U/L (ref 0–44)
AST: 20 U/L (ref 15–41)
Albumin: 4.1 g/dL (ref 3.5–5.0)
Alkaline Phosphatase: 63 U/L (ref 38–126)
Anion gap: 8 (ref 5–15)
BUN: 12 mg/dL (ref 6–20)
CO2: 23 mmol/L (ref 22–32)
Calcium: 9 mg/dL (ref 8.9–10.3)
Chloride: 107 mmol/L (ref 98–111)
Creatinine, Ser: 0.49 mg/dL (ref 0.44–1.00)
GFR, Estimated: >60 mL/min (ref >60)
Glucose, Bld: 117 mg/dL — ABNORMAL HIGH (ref 70–99)
Potassium: 3.5 mmol/L (ref 3.5–5.1)
Sodium: 138 mmol/L (ref 135–145)
Total Bilirubin: 0.6 mg/dL (ref 0.3–1.2)
Total Protein: 7.8 g/dL (ref 6.5–8.1)

## 2021-11-15 LAB — PREGNANCY, URINE: Preg Test, Ur: NEGATIVE

## 2021-11-15 LAB — LIPASE, BLOOD: Lipase: 33 U/L (ref 11–51)

## 2021-11-15 MED ORDER — IOHEXOL 300 MG/ML  SOLN
100.0000 mL | Freq: Once | INTRAMUSCULAR | Status: AC | PRN
  Administered 2021-11-15: 100 mL via INTRAVENOUS

## 2021-11-15 MED ORDER — PANTOPRAZOLE SODIUM 40 MG PO TBEC: 40.0000 mg | DELAYED_RELEASE_TABLET | Freq: Every day | ORAL | 0 refills | Status: DC

## 2021-11-15 NOTE — ED Provider Notes (Signed)
? ?[facility] EMERGENCY DEPARTMENT  ?Provider Note ? ?CSN: [id number] ?Arrival date & time: 11/15/21 0329 ? ?History ?Chief Complaint  ?Patient presents with  ? Abdominal Pain  ? ? ?Jaclyn Thornton is a 40 y.o. female reports about 2 weeks of intermittent epigastric and RUQ abdominal pain. Comes and goes, some improvement with OTC indigestion meds. Became more severe around 1am prompting her ED visit. No vomiting or fever. No history of similar.  ? ? ?Home Medications ?Prior to Admission medications   ?Medication Sig Start Date End Date Taking? Authorizing Provider  ?pantoprazole (PROTONIX) 40 MG tablet Take 1 tablet (40 mg total) by mouth daily. 11/15/21  Yes Pollyann Savoy, MD  ?acetaminophen (TYLENOL) 500 MG tablet Take 500 mg by mouth every 6 (six) hours as needed for headache.     [provider]  ?amLODipine (NORVASC) 5 MG tablet Take 1 tablet (5 mg total) by mouth daily. 03/09/21   Jacklyn Shell, CNM  ?Aspirin-Salicylamide-Caffeine (BC HEADACHE POWDER PO) Take by mouth.    [provider]  ?diphenhydrAMINE HCl (BENADRYL PO) Take by mouth as needed.    [provider]  ?fluconazole (DIFLUCAN) 150 MG tablet Take 1 now and 1 in 3 days if needed 09/02/21   Adline Potter, NP  ? ? ? ?Allergies    ?Patient has no known allergies. ? ? ?Review of Systems   ?Review of Systems ?Please see HPI for pertinent positives and negatives ? ?Physical Exam ?BP 121/88   Pulse 72   Temp 98.1 ?F (36.7 ?C) (Oral)   Resp 17   Ht 5\' 6"  (1.676 m)   Wt 86.2 kg   LMP 09/19/2021   SpO2 100%   BMI 30.67 kg/m?  ? ?Physical Exam ?Vitals and nursing note reviewed.  ?Constitutional:   ?   Appearance: Normal appearance.  ?HENT:  ?   Head: Normocephalic and atraumatic.  ?   Nose: Nose normal.  ?   Mouth/Throat:  ?   Mouth: Mucous membranes are moist.  ?Eyes:  ?   Extraocular Movements: Extraocular movements intact.  ?   Conjunctiva/sclera: Conjunctivae normal.  ?Cardiovascular:  ?   Rate and  Rhythm: Normal rate.  ?Pulmonary:  ?   Effort: Pulmonary effort is normal.  ?   Breath sounds: Normal breath sounds.  ?Abdominal:  ?   General: Abdomen is flat.  ?   Palpations: Abdomen is soft.  ?   Tenderness: There is abdominal tenderness in the right upper quadrant. There is no guarding. Negative signs include Murphy's sign and McBurney's sign.  ?Musculoskeletal:     ?   General: No swelling. Normal range of motion.  ?   Cervical back: Neck supple.  ?Skin: ?   General: Skin is warm and dry.  ?Neurological:  ?   General: No focal deficit present.  ?   Mental Status: She is alert.  ?Psychiatric:     ?   Mood and Affect: Mood normal.  ? ? ?ED Results / Procedures / Treatments   ?EKG ?None ? ?Procedures ?Procedures ? ?Medications Ordered in the ED ?Medications  ?iohexol (OMNIPAQUE) 300 MG/ML solution 100 mL (100 mLs Intravenous Contrast Given 11/15/21 0506)  ? ? ?Initial Impression and Plan ? Patient with RUQ abdominal pain. Vitals are normal, exam is benign. Will check labs, send for CT to eval biliary disease/stones.  ? ?ED Course  ? ?Clinical Course as of 11/15/21 0537  ?Tue Nov 15, 2021  ?0417 CBC is normal.  [  CS]  ?0424 UA and HCG are normal.  [CS]  ?0457 CMP/Lipase are normal.  [CS]  ?1610 I personally viewed the images from radiology studies and agree with radiologist interpretation: CT neg for acute process. Patient aware of DDD. Will start PPI, refer to GI for follow up if not improving.  ? [CS]  ?  ?Clinical Course User Index ?[CS] Pollyann Savoy, MD  ? ? ? ?MDM Rules/Calculators/A&P ?Medical Decision Making ?Problems Addressed: ?Epigastric pain: acute illness or injury ? ?Amount and/or Complexity of Data Reviewed ?Labs: ordered. Decision-making details documented in ED Course. ?Radiology: ordered and independent interpretation performed. Decision-making details documented in ED Course. ? ?Risk ?Prescription drug management. ? ? ? ?Final Clinical Impression(s) / ED Diagnoses ?Final diagnoses:   ?Epigastric pain  ? ? ?Rx / DC Orders ?ED Discharge Orders   ? ?      Ordered  ?  pantoprazole (PROTONIX) 40 MG tablet  Daily       ? 11/15/21 0536  ? ?  ?  ? ?  ? ?  ?Pollyann Savoy, MD ?11/15/21 [phone] ? ?

## 2021-11-15 NOTE — ED Triage Notes (Signed)
Pt c/o upper abd pain with chest pain. Pt states abd pain has been going on x 2 weeks but worse tonight.  ?

## 2021-11-15 NOTE — ED Notes (Signed)
Patient transported to CT 

## 2022-02-20 ENCOUNTER — Ambulatory Visit: Payer: Medicaid Other | Admitting: Advanced Practice Midwife

## 2022-03-02 ENCOUNTER — Other Ambulatory Visit: Payer: Self-pay | Admitting: Advanced Practice Midwife

## 2022-03-22 ENCOUNTER — Encounter: Payer: Self-pay | Admitting: Advanced Practice Midwife

## 2022-03-22 ENCOUNTER — Other Ambulatory Visit (HOSPITAL_COMMUNITY)
Admission: RE | Admit: 2022-03-22 | Discharge: 2022-03-22 | Disposition: A | Discharge status: Home / Self Care | Payer: Medicaid Other | Source: Ambulatory Visit | Attending: Advanced Practice Midwife | Admitting: Advanced Practice Midwife

## 2022-03-22 ENCOUNTER — Ambulatory Visit (INDEPENDENT_AMBULATORY_CARE_PROVIDER_SITE_OTHER): Payer: Medicaid Other | Admitting: Advanced Practice Midwife

## 2022-03-22 VITALS — BP 124/78 | HR 86 | Ht 67.0 in | Wt 201.0 lb

## 2022-03-22 DIAGNOSIS — Z9851 Tubal ligation status: Secondary | ICD-10-CM | POA: Diagnosis not present

## 2022-03-22 DIAGNOSIS — Z9079 Acquired absence of other genital organ(s): Secondary | ICD-10-CM

## 2022-03-22 DIAGNOSIS — Z1272 Encounter for screening for malignant neoplasm of vagina: Secondary | ICD-10-CM

## 2022-03-22 DIAGNOSIS — Z01419 Encounter for gynecological examination (general) (routine) without abnormal findings: Secondary | ICD-10-CM | POA: Insufficient documentation

## 2022-03-22 DIAGNOSIS — I10 Essential (primary) hypertension: Secondary | ICD-10-CM

## 2022-03-22 DIAGNOSIS — G43839 Menstrual migraine, intractable, without status migrainosus: Secondary | ICD-10-CM | POA: Diagnosis not present

## 2022-03-22 NOTE — Progress Notes (Signed)
Wilmer Floor 41 y.o.  Vitals:   03/22/22 1344  BP: 124/78  Pulse: 86     Filed Weights   03/22/22 1344  Weight: 201 lb (91.2 kg)    Past Medical History: Past Medical History:  Diagnosis Date   Anemia    Ectopic pregnancy 2016   Migraines     Past Surgical History: Past Surgical History:  Procedure Laterality Date   DILATION AND CURETTAGE OF UTERUS     LAPAROSCOPIC BILATERAL SALPINGECTOMY Bilateral 07/23/2015   Procedure: LAPAROSCOPIC BILATERAL SALPINGECTOMY;  Surgeon: Tilda Burrow, MD;  Location: [facility];  Service: Gynecology;  Laterality: Bilateral;   TUBAL LIGATION  2001    Family History: Family History  Problem Relation Age of Onset   Diabetes Mother    Hypertension Mother    Diabetes Father    Hypertension Father    Breast cancer Maternal Aunt    Ovarian cancer Maternal Aunt    Breast cancer Paternal Aunt    Breast cancer Paternal Aunt    Dementia Paternal Grandmother    Diabetes Brother     Social History: Social History   Tobacco Use   Smoking status: Never   Smokeless tobacco: Never  Vaping Use   Vaping Use: Never used  Substance Use Topics   Alcohol use: Yes    Alcohol/week: 0.0 standard drinks of alcohol    Comment: occ   Drug use: No    Allergies: No Known Allergies    Current Outpatient Medications:    acetaminophen (TYLENOL) 500 MG tablet, Take 500 mg by mouth every 6 (six) hours as needed for headache. , Disp: , Rfl:    amLODipine (NORVASC) 5 MG tablet, TAKE 1 TABLET(5 MG) BY MOUTH DAILY, Disp: 30 tablet, Rfl: 11   Aspirin-Salicylamide-Caffeine (BC HEADACHE POWDER PO), Take by mouth., Disp: , Rfl:    diphenhydrAMINE HCl (BENADRYL PO), Take by mouth as needed. (Patient not taking: Reported on 03/22/2022), Disp: , Rfl:    fluconazole (DIFLUCAN) 150 MG tablet, Take 1 now and 1 in 3 days if needed (Patient not taking: Reported on 03/22/2022), Disp: 2 tablet, Rfl: 1   pantoprazole (PROTONIX) 40 MG tablet, Take 1 tablet (40 mg total) by  mouth daily. (Patient not taking: Reported on 03/22/2022), Disp: 30 tablet, Rfl: 0  History of Present Illness: here for pap and physical,  Periods are fine, but still has migraine HA the day before, of and after menses starts.  Still has FP medicaid, no other insurance.  Takes BP pills.  Prior cytology:  Date Result Procedure  02/06/19 NILM w/ HRHPV negative None                 Review of Systems   Patient denies any blurred vision, shortness of breath, chest pain, abdominal pain, problems with bowel movements, urination, or intercourse.   Physical Exam: General:  Well developed, well nourished, no acute distress Skin:  Warm and dry Neck:  Midline trachea, normal thyroid Lungs; Clear to auscultation bilaterally Breast:  No dominant palpable mass, retraction, or nipple discharge Cardiovascular: Regular rate and rhythm Abdomen:  Soft, non tender, no hepatosplenomegaly Pelvic:  External genitalia is normal in appearance.  The vagina is normal in appearance.  The cervix is bulbous.  Uterus is felt to be normal size, shape, and contour.  No adnexal masses or tenderness noted.  Extremities:  No swelling or varicosities noted Psych:  No mood changes.     Impression: normal GYN exam   Menstrual  migraines  Plan: if pap normal, repeat in 3 years.  Message to [facility] program for mammogram, due in November

## 2022-03-23 ENCOUNTER — Encounter: Payer: Self-pay | Admitting: Advanced Practice Midwife

## 2022-03-27 LAB — CYTOLOGY - PAP
Chlamydia: NEGATIVE
Comment: NEGATIVE
Comment: NEGATIVE
Comment: NORMAL
Diagnosis: NEGATIVE
High risk HPV: NEGATIVE
Neisseria Gonorrhea: NEGATIVE

## 2022-06-29 ENCOUNTER — Other Ambulatory Visit: Payer: Self-pay | Admitting: Obstetrics and Gynecology

## 2022-06-29 DIAGNOSIS — Z1231 Encounter for screening mammogram for malignant neoplasm of breast: Secondary | ICD-10-CM

## 2022-07-26 NOTE — Telephone Encounter (Signed)
Hello Jaclyn Thornton,  I am contacting you in regards to your appointment with the [facility] program. We tried to contact you by phone to let you know but have been unable to reach you. We need to reschedule your upcoming appointment with the [place] program and your screening mammogram appointment. We have you rescheduled for Friday, August 25, 2022 @ 9:00 am at [facility]. When you arrive you will need to come to the 4th floor [place] for check-in. If you have any questions or if this appointment doesn't work for you our main # is [phone].  Thanks,  [employer]

## 2022-07-28 ENCOUNTER — Ambulatory Visit (HOSPITAL_COMMUNITY): Payer: Medicaid Other

## 2022-07-28 ENCOUNTER — Inpatient Hospital Stay: Payer: Medicaid Other

## 2022-08-25 ENCOUNTER — Ambulatory Visit (HOSPITAL_COMMUNITY)
Admission: RE | Admit: 2022-08-25 | Discharge: 2022-08-25 | Disposition: A | Discharge status: Home / Self Care | Payer: Medicaid Other | Source: Ambulatory Visit | Attending: Obstetrics and Gynecology | Admitting: Obstetrics and Gynecology

## 2022-08-25 ENCOUNTER — Inpatient Hospital Stay: Payer: Medicaid Other | Attending: Hematology | Admitting: Hematology and Oncology

## 2022-08-25 VITALS — BP 119/79 | Wt 195.6 lb

## 2022-08-25 DIAGNOSIS — Z1231 Encounter for screening mammogram for malignant neoplasm of breast: Secondary | ICD-10-CM

## 2022-08-25 DIAGNOSIS — Z1239 Encounter for other screening for malignant neoplasm of breast: Secondary | ICD-10-CM

## 2022-08-25 NOTE — Patient Instructions (Signed)
Jaclyn Thornton about self breast awareness and gave educational materials to take home. Patient did not need a Pap smear today due to last Pap smear was in 2023 per patient. Let her know [facility] will cover Pap smears every 5 years unless has a history of abnormal Pap smears. Referred patient to the [facility] for screening mammogram. Appointment scheduled for 08/25/22. Patient aware of appointment and will be there. Let patient know will follow up with her within the next couple weeks with results. [place] verbalized understanding.  Melodye Ped, NP 9:08 AM

## 2022-08-25 NOTE — Progress Notes (Signed)
[name] is a 43 y.o. female who presents to [facility] clinic today with no complaints .    Pap Smear: Pap not smear completed today. Last Pap smear was 2023 at [facility] clinic and was normal. Per patient has no history of an abnormal Pap smear. Last Pap smear result is available in Epic.   Physical exam: Breasts Breasts symmetrical. No skin abnormalities bilateral breasts. No nipple retraction bilateral breasts. No nipple discharge bilateral breasts. No lymphadenopathy. No lumps palpated bilateral breasts.     MS DIGITAL SCREENING TOMO BILATERAL  Result Date: 06/03/2021 CLINICAL DATA:  Screening. EXAM: DIGITAL SCREENING BILATERAL MAMMOGRAM WITH TOMOSYNTHESIS AND CAD TECHNIQUE: Bilateral screening digital craniocaudal and mediolateral oblique mammograms were obtained. Bilateral screening digital breast tomosynthesis was performed. The images were evaluated with computer-aided detection. COMPARISON:  Previous exam(s). ACR Breast Density Category b: There are scattered areas of fibroglandular density. FINDINGS: There are no findings suspicious for malignancy. IMPRESSION: No mammographic evidence of malignancy. A result letter of this screening mammogram will be mailed directly to the patient. RECOMMENDATION: Screening mammogram in one year. (Code:SM-B-01Y) BI-RADS CATEGORY  1: Negative. Electronically Signed   By: Fidela Salisbury M.D.   On: 06/03/2021 14:38      Pelvic/Bimanual Pap is not indicated today    Smoking History: Patient has never smoked and was not referred to quit line.    Patient Navigation: Patient education provided. Access to services provided for patient through [facility] program. No interpreter provided. No transportation provided   Colorectal Cancer Screening: Per patient has never had colonoscopy completed No complaints today.    Breast and Cervical Cancer Risk Assessment: Patient does not have family history of breast cancer, known genetic mutations, or  radiation treatment to the chest before age 80. Patient does not have history of cervical dysplasia, immunocompromised, or DES exposure in-utero.  Risk Assessment   No risk assessment data for the current encounter  Risk Scores       06/03/2021   Last edited by: Loletta Parish, RN   5-year risk: 0.4 %   Lifetime risk: 7.3 %            A: [facility] exam without pap smear No complaints with benign exam.   P: Referred patient to the [facility] Center for a screening mammogram. Appointment scheduled 08/25/22.  Dayton Scrape A, NP 08/25/2022 9:07 AM

## 2022-09-06 ENCOUNTER — Encounter: Payer: Self-pay | Admitting: Advanced Practice Midwife

## 2023-02-28 ENCOUNTER — Other Ambulatory Visit: Payer: Self-pay | Admitting: Advanced Practice Midwife

## 2023-04-05 ENCOUNTER — Ambulatory Visit (INDEPENDENT_AMBULATORY_CARE_PROVIDER_SITE_OTHER): Payer: Medicaid Other | Admitting: Advanced Practice Midwife

## 2023-04-05 ENCOUNTER — Encounter: Payer: Self-pay | Admitting: Advanced Practice Midwife

## 2023-04-05 VITALS — BP 133/86 | HR 73 | Ht 66.0 in | Wt 204.0 lb

## 2023-04-05 DIAGNOSIS — Z13220 Encounter for screening for lipoid disorders: Secondary | ICD-10-CM

## 2023-04-05 DIAGNOSIS — G43839 Menstrual migraine, intractable, without status migrainosus: Secondary | ICD-10-CM

## 2023-04-05 DIAGNOSIS — Z01419 Encounter for gynecological examination (general) (routine) without abnormal findings: Secondary | ICD-10-CM

## 2023-04-05 DIAGNOSIS — Z131 Encounter for screening for diabetes mellitus: Secondary | ICD-10-CM | POA: Diagnosis not present

## 2023-04-05 MED ORDER — AMLODIPINE BESYLATE 5 MG PO TABS: 5.0000 mg | ORAL_TABLET | Freq: Every day | ORAL | 4 refills | Status: DC

## 2023-04-05 NOTE — Progress Notes (Signed)
Jaclyn Thornton 42 y.o.  Vitals:   04/05/23 1051  BP: 133/86  Pulse: 73     Filed Weights   04/05/23 1051  Weight: 204 lb (92.5 kg)    Past Medical History: Past Medical History:  Diagnosis Date   Anemia    Ectopic pregnancy 2016   Migraines     Past Surgical History: Past Surgical History:  Procedure Laterality Date   DILATION AND CURETTAGE OF UTERUS     LAPAROSCOPIC BILATERAL SALPINGECTOMY Bilateral 07/23/2015   Procedure: LAPAROSCOPIC BILATERAL SALPINGECTOMY;  Surgeon: Tilda Burrow, MD;  Location: [facility];  Service: Gynecology;  Laterality: Bilateral;   TUBAL LIGATION  2001    Family History: Family History  Problem Relation Age of Onset   Diabetes Mother    Hypertension Mother    Diabetes Father    Hypertension Father    Breast cancer Maternal Aunt    Ovarian cancer Maternal Aunt    Breast cancer Paternal Aunt    Breast cancer Paternal Aunt    Dementia Paternal Grandmother    Diabetes Brother     Social History: Social History   Tobacco Use   Smoking status: Never   Smokeless tobacco: Never  Vaping Use   Vaping status: Never Used  Substance Use Topics   Alcohol use: Yes    Alcohol/week: 0.0 standard drinks of alcohol    Comment: occ   Drug use: No    Allergies:  Allergies  Allergen Reactions   Hydrocodone Nausea And Vomiting      Current Outpatient Medications:    acetaminophen (TYLENOL) 500 MG tablet, Take 500 mg by mouth every 6 (six) hours as needed for headache. , Disp: , Rfl:    Aspirin-Salicylamide-Caffeine (BC HEADACHE POWDER PO), Take by mouth., Disp: , Rfl:    diphenhydrAMINE HCl (BENADRYL PO), Take by mouth as needed., Disp: , Rfl:    amLODipine (NORVASC) 5 MG tablet, Take 1 tablet (5 mg total) by mouth daily., Disp: 90 tablet, Rfl: 4  History of Present Illness: Here for annual visit.  Still has menstrual migraines, has tried COCs and estrogen supplementation at menses w/little results/compliance. Takes FMLA monthly. Mammogram  2/24 normal.   Prior cytology:      Component Value Date/Time   DIAGPAP  03/22/2022 1344    - Negative for intraepithelial lesion or malignancy (NILM)   DIAGPAP  02/06/2019 0000    NEGATIVE FOR INTRAEPITHELIAL LESIONS OR MALIGNANCY.   HPVHIGH Negative 03/22/2022 1344   ADEQPAP  03/22/2022 1344    Satisfactory for evaluation; transformation zone component PRESENT.   ADEQPAP  02/06/2019 0000    Satisfactory for evaluation  endocervical/transformation zone component PRESENT.      Review of Systems   Patient denies any headaches, blurred vision, shortness of breath, chest pain, abdominal pain, problems with bowel movements, urination, or intercourse.   Physical Exam: General:  Well developed, well nourished, no acute distress Skin:  Warm and dry Neck:  Midline trachea Lungs; Clear to auscultation bilaterally Breast:  No dominant palpable mass, retraction, or nipple discharge Cardiovascular: Regular rate and rhythm Abdomen:  Soft, non tender, no hepatosplenomegaly Pelvic:  External genitalia is normal in appearance.  The vagina is normal in appearance.  The cervix is bulbous.  Uterus is felt to be normal size, shape, and contour.  No adnexal masses or tenderness noted. Exam limited by habitus  Extremities:  No swelling or varicosities noted Psych:  No mood changes.     Impression: Normal GYN exam  Plan: Pap due 2026 Mammogram Feb 2025 Colonscopy at 43 FMLA continued Continue Norvasc 5mg  daily   Orders Placed This Encounter  Procedures   CBC   Comprehensive metabolic panel    Order Specific Question:   Has the patient fasted?    Answer:   Yes   Hemoglobin A1c   Lipid panel    Order Specific Question:   Has the patient fasted?    Answer:   Yes

## 2023-04-05 NOTE — Patient Instructions (Signed)
[facility]:        [employer] [phone]                 [facility] [phone] (usually not accepting new patients unless you have family there already, you are always welcome to call and ask)       [facility] Department [phone]    [facility] Clinic:  [phone]  [facility]:  [phone]     [facility] Doctors:   [facility]: [phone]  [facility]: [phone]  [facility] Doctors:   [facility]: [phone]   [facility]: [phone]  [facility] Doctors:  [facility]: [phone]

## 2023-04-06 LAB — HEMOGLOBIN A1C
Est. average glucose Bld gHb Est-mCnc: 123 mg/dL
Hgb A1c MFr Bld: 5.9 % — ABNORMAL HIGH (ref 4.8–5.6)

## 2023-04-06 LAB — LIPID PANEL
Chol/HDL Ratio: 3.4 ratio (ref 0.0–4.4)
Cholesterol, Total: 247 mg/dL — ABNORMAL HIGH (ref 100–199)
HDL: 72 mg/dL (ref >39)
LDL Chol Calc (NIH): 155 mg/dL — ABNORMAL HIGH (ref 0–99)
Triglycerides: 112 mg/dL (ref 0–149)
VLDL Cholesterol Cal: 20 mg/dL (ref 5–40)

## 2023-04-06 LAB — CBC
Hematocrit: 38.2 % (ref 34.0–46.6)
Hemoglobin: 11.6 g/dL (ref 11.1–15.9)
MCH: 26.7 pg (ref 26.6–33.0)
MCHC: 30.4 g/dL — ABNORMAL LOW (ref 31.5–35.7)
MCV: 88 fL (ref 79–97)
Platelets: 345 10*3/uL (ref 150–450)
RBC: 4.35 x10E6/uL (ref 3.77–5.28)
RDW: 13.9 % (ref 11.7–15.4)
WBC: 9.5 10*3/uL (ref 3.4–10.8)

## 2023-04-06 LAB — COMPREHENSIVE METABOLIC PANEL
ALT: 28 IU/L (ref 0–32)
AST: 23 IU/L (ref 0–40)
Albumin: 4.5 g/dL (ref 3.9–4.9)
Alkaline Phosphatase: 95 IU/L (ref 44–121)
BUN/Creatinine Ratio: 18 (ref 9–23)
BUN: 11 mg/dL (ref 6–24)
Bilirubin Total: 0.3 mg/dL (ref 0.0–1.2)
CO2: 22 mmol/L (ref 20–29)
Calcium: 9.2 mg/dL (ref 8.7–10.2)
Chloride: 104 mmol/L (ref 96–106)
Creatinine, Ser: 0.62 mg/dL (ref 0.57–1.00)
Globulin, Total: 2.8 g/dL (ref 1.5–4.5)
Glucose: 87 mg/dL (ref 70–99)
Potassium: 4.2 mmol/L (ref 3.5–5.2)
Sodium: 141 mmol/L (ref 134–144)
Total Protein: 7.3 g/dL (ref 6.0–8.5)
eGFR: 113 mL/min/{1.73_m2} (ref >59)

## 2023-04-11 ENCOUNTER — Encounter: Payer: Self-pay | Admitting: Advanced Practice Midwife

## 2023-05-18 DIAGNOSIS — K279 Peptic ulcer, site unspecified, unspecified as acute or chronic, without hemorrhage or perforation: Secondary | ICD-10-CM

## 2023-05-18 HISTORY — DX: Peptic ulcer, site unspecified, unspecified as acute or chronic, without hemorrhage or perforation: K27.9

## 2023-05-21 ENCOUNTER — Encounter: Payer: Self-pay | Admitting: Advanced Practice Midwife

## 2023-05-21 ENCOUNTER — Other Ambulatory Visit: Payer: Self-pay | Admitting: Adult Health

## 2023-05-21 MED ORDER — FLUCONAZOLE 150 MG PO TABS: ORAL_TABLET | ORAL | 1 refills | Status: DC

## 2023-05-21 NOTE — Progress Notes (Signed)
Rx diflucan.  

## 2023-05-29 ENCOUNTER — Encounter (HOSPITAL_COMMUNITY): Payer: Self-pay | Admitting: Family Medicine

## 2023-05-29 ENCOUNTER — Observation Stay (HOSPITAL_COMMUNITY): Payer: Medicaid Other | Admitting: Anesthesiology

## 2023-05-29 ENCOUNTER — Encounter (HOSPITAL_COMMUNITY)
Admission: EM | Disposition: A | Discharge status: Home / Self Care | Payer: Self-pay | Source: Home / Self Care | Attending: Student

## 2023-05-29 ENCOUNTER — Observation Stay (HOSPITAL_COMMUNITY)
Admission: EM | Admit: 2023-05-29 | Discharge: 2023-05-30 | Disposition: A | Discharge status: Home / Self Care | Payer: Medicaid Other | Attending: Family Medicine | Admitting: Family Medicine

## 2023-05-29 ENCOUNTER — Other Ambulatory Visit: Payer: Self-pay

## 2023-05-29 ENCOUNTER — Emergency Department (HOSPITAL_COMMUNITY): Payer: Medicaid Other

## 2023-05-29 DIAGNOSIS — D62 Acute posthemorrhagic anemia: Principal | ICD-10-CM

## 2023-05-29 DIAGNOSIS — K315 Obstruction of duodenum: Secondary | ICD-10-CM | POA: Insufficient documentation

## 2023-05-29 DIAGNOSIS — Z791 Long term (current) use of non-steroidal anti-inflammatories (NSAID): Secondary | ICD-10-CM | POA: Diagnosis not present

## 2023-05-29 DIAGNOSIS — D649 Anemia, unspecified: Principal | ICD-10-CM | POA: Diagnosis present

## 2023-05-29 DIAGNOSIS — K219 Gastro-esophageal reflux disease without esophagitis: Secondary | ICD-10-CM | POA: Insufficient documentation

## 2023-05-29 DIAGNOSIS — K269 Duodenal ulcer, unspecified as acute or chronic, without hemorrhage or perforation: Secondary | ICD-10-CM | POA: Insufficient documentation

## 2023-05-29 DIAGNOSIS — K2970 Gastritis, unspecified, without bleeding: Secondary | ICD-10-CM | POA: Insufficient documentation

## 2023-05-29 DIAGNOSIS — K2950 Unspecified chronic gastritis without bleeding: Secondary | ICD-10-CM | POA: Diagnosis not present

## 2023-05-29 DIAGNOSIS — D509 Iron deficiency anemia, unspecified: Secondary | ICD-10-CM | POA: Diagnosis not present

## 2023-05-29 DIAGNOSIS — Z7982 Long term (current) use of aspirin: Secondary | ICD-10-CM | POA: Diagnosis not present

## 2023-05-29 DIAGNOSIS — R0602 Shortness of breath: Secondary | ICD-10-CM | POA: Diagnosis present

## 2023-05-29 DIAGNOSIS — Z79899 Other long term (current) drug therapy: Secondary | ICD-10-CM | POA: Insufficient documentation

## 2023-05-29 DIAGNOSIS — K921 Melena: Secondary | ICD-10-CM | POA: Insufficient documentation

## 2023-05-29 HISTORY — PX: BIOPSY: SHX5522

## 2023-05-29 HISTORY — PX: ESOPHAGOGASTRODUODENOSCOPY (EGD) WITH PROPOFOL: SHX5813

## 2023-05-29 LAB — CBC
HCT: 21.2 % — ABNORMAL LOW (ref 36.0–46.0)
Hemoglobin: 6.7 g/dL — CL (ref 12.0–15.0)
MCH: 28.6 pg (ref 26.0–34.0)
MCHC: 31.6 g/dL (ref 30.0–36.0)
MCV: 90.6 fL (ref 80.0–100.0)
Platelets: 306 10*3/uL (ref 150–400)
RBC: 2.34 MIL/uL — ABNORMAL LOW (ref 3.87–5.11)
RDW: 18.6 % — ABNORMAL HIGH (ref 11.5–15.5)
WBC: 7.8 10*3/uL (ref 4.0–10.5)
nRBC: 0.9 % — ABNORMAL HIGH (ref 0.0–0.2)

## 2023-05-29 LAB — BASIC METABOLIC PANEL
Anion gap: 7 (ref 5–15)
BUN: 9 mg/dL (ref 6–20)
CO2: 25 mmol/L (ref 22–32)
Calcium: 8.9 mg/dL (ref 8.9–10.3)
Chloride: 104 mmol/L (ref 98–111)
Creatinine, Ser: 0.53 mg/dL (ref 0.44–1.00)
GFR, Estimated: >60 mL/min (ref >60)
Glucose, Bld: 119 mg/dL — ABNORMAL HIGH (ref 70–99)
Potassium: 3.3 mmol/L — ABNORMAL LOW (ref 3.5–5.1)
Sodium: 136 mmol/L (ref 135–145)

## 2023-05-29 LAB — IRON AND TIBC
Iron: 22 ug/dL — ABNORMAL LOW (ref 28–170)
Saturation Ratios: 5 % — ABNORMAL LOW (ref 10.4–31.8)
TIBC: 443 ug/dL (ref 250–450)
UIBC: 421 ug/dL

## 2023-05-29 LAB — HEMOGLOBIN AND HEMATOCRIT, BLOOD
HCT: 25.9 % — ABNORMAL LOW (ref 36.0–46.0)
Hemoglobin: 7.8 g/dL — ABNORMAL LOW (ref 12.0–15.0)

## 2023-05-29 LAB — PREPARE RBC (CROSSMATCH)

## 2023-05-29 LAB — HIV ANTIBODY (ROUTINE TESTING W REFLEX): HIV Screen 4th Generation wRfx: NONREACTIVE

## 2023-05-29 LAB — TROPONIN I (HIGH SENSITIVITY)
Troponin I (High Sensitivity): 2 ng/L (ref <18)
Troponin I (High Sensitivity): 3 ng/L (ref <18)

## 2023-05-29 LAB — FERRITIN: Ferritin: 12 ng/mL (ref 11–307)

## 2023-05-29 LAB — POCT PREGNANCY, URINE: Preg Test, Ur: NEGATIVE

## 2023-05-29 SURGERY — ESOPHAGOGASTRODUODENOSCOPY (EGD) WITH PROPOFOL
Anesthesia: General

## 2023-05-29 MED ORDER — HYDRALAZINE HCL 20 MG/ML IJ SOLN
10.0000 mg | Freq: Three times a day (TID) | INTRAMUSCULAR | Status: DC | PRN
Start: 2023-05-29 — End: 2023-05-30

## 2023-05-29 MED ORDER — SODIUM CHLORIDE 0.9% IV SOLUTION: Freq: Once | INTRAVENOUS | Status: DC

## 2023-05-29 MED ORDER — SODIUM CHLORIDE 0.9% FLUSH
3.0000 mL | INTRAVENOUS | Status: DC | PRN
  Administered 2023-05-30: 3 mL via INTRAVENOUS

## 2023-05-29 MED ORDER — SUMATRIPTAN SUCCINATE 50 MG PO TABS
50.0000 mg | ORAL_TABLET | Freq: Once | ORAL | Status: AC
Start: 2023-05-29 — End: 2023-05-29
  Administered 2023-05-29: 50 mg via ORAL
  Filled 2023-05-29: qty 1

## 2023-05-29 MED ORDER — PROPOFOL 10 MG/ML IV BOLUS
INTRAVENOUS | Status: DC | PRN
  Administered 2023-05-29 (×3): 100 mg via INTRAVENOUS

## 2023-05-29 MED ORDER — LACTATED RINGERS IV SOLN: INTRAVENOUS | Status: DC | PRN

## 2023-05-29 MED ORDER — PANTOPRAZOLE SODIUM 40 MG IV SOLR
40.0000 mg | Freq: Once | INTRAVENOUS | Status: AC
  Administered 2023-05-29: 40 mg via INTRAVENOUS
  Filled 2023-05-29: qty 10

## 2023-05-29 MED ORDER — ONDANSETRON HCL 4 MG PO TABS: 4.0000 mg | ORAL_TABLET | Freq: Four times a day (QID) | ORAL | Status: DC | PRN

## 2023-05-29 MED ORDER — ACETAMINOPHEN 325 MG PO TABS
650.0000 mg | ORAL_TABLET | Freq: Four times a day (QID) | ORAL | Status: DC | PRN
  Administered 2023-05-29: 650 mg via ORAL
  Filled 2023-05-29 (×2): qty 2

## 2023-05-29 MED ORDER — SODIUM CHLORIDE 0.9 % IV SOLN: 250.0000 mL | INTRAVENOUS | Status: AC | PRN

## 2023-05-29 MED ORDER — LIDOCAINE HCL (CARDIAC) PF 100 MG/5ML IV SOSY
PREFILLED_SYRINGE | INTRAVENOUS | Status: DC | PRN
  Administered 2023-05-29: 60 mg via INTRAVENOUS

## 2023-05-29 MED ORDER — ACETAMINOPHEN 650 MG RE SUPP: 650.0000 mg | Freq: Four times a day (QID) | RECTAL | Status: DC | PRN

## 2023-05-29 MED ORDER — SODIUM CHLORIDE 0.9% FLUSH
3.0000 mL | Freq: Two times a day (BID) | INTRAVENOUS | Status: DC
  Administered 2023-05-30: 3 mL via INTRAVENOUS

## 2023-05-29 MED ORDER — POTASSIUM CHLORIDE CRYS ER 20 MEQ PO TBCR
40.0000 meq | EXTENDED_RELEASE_TABLET | Freq: Once | ORAL | Status: AC
  Administered 2023-05-29: 40 meq via ORAL
  Filled 2023-05-29: qty 2

## 2023-05-29 MED ORDER — PANTOPRAZOLE SODIUM 40 MG IV SOLR
40.0000 mg | Freq: Two times a day (BID) | INTRAVENOUS | Status: DC
  Administered 2023-05-29 – 2023-05-30 (×3): 40 mg via INTRAVENOUS
  Filled 2023-05-29 (×3): qty 10

## 2023-05-29 MED ORDER — SODIUM CHLORIDE 0.9 % IV SOLN: INTRAVENOUS | Status: DC

## 2023-05-29 MED ORDER — ONDANSETRON HCL 4 MG/2ML IJ SOLN: 4.0000 mg | Freq: Four times a day (QID) | INTRAMUSCULAR | Status: DC | PRN

## 2023-05-29 NOTE — Progress Notes (Signed)
Returned from EGD stable condition  05/29/23 1400  Vitals  Temp 98.4 F (36.9 C)  BP (!) 108/93  MAP (mmHg) 100  Pulse Rate 85  Resp 20  Level of Consciousness  Level of Consciousness Alert  MEWS COLOR  MEWS Score Color Green  Oxygen Therapy  SpO2 100 %  O2 Device Room Air  MEWS Score  MEWS Temp 0  MEWS Systolic 0  MEWS Pulse 0  MEWS RR 0  MEWS LOC 0  MEWS Score 0

## 2023-05-29 NOTE — Anesthesia Postprocedure Evaluation (Signed)
Anesthesia Post Note  Patient: Jaclyn Thornton  Procedure(s) Performed: ESOPHAGOGASTRODUODENOSCOPY (EGD) WITH PROPOFOL BIOPSY  Patient location during evaluation: PACU Anesthesia Type: General Level of consciousness: awake and alert Pain management: pain level controlled Vital Signs Assessment: post-procedure vital signs reviewed and stable Respiratory status: spontaneous breathing, nonlabored ventilation, respiratory function stable and patient connected to nasal cannula oxygen Cardiovascular status: blood pressure returned to baseline and stable Postop Assessment: no apparent nausea or vomiting Anesthetic complications: no   There were no known notable events for this encounter.   Last Vitals:  Vitals:   05/29/23 1345 05/29/23 1400  BP: (!) 121/90 (!) 108/93  Pulse: 86 85  Resp: 16 20  Temp: 36.7 C 36.9 C  SpO2: 100% 100%    Last Pain:  Vitals:   05/29/23 1345  TempSrc:   PainSc: 0-No pain                 Hazelyn Kallen L [place] Karl

## 2023-05-29 NOTE — Op Note (Signed)
[facility] Patient Name: Jaclyn Thornton Procedure Date: 05/29/2023 12:59 PM MRN: [id number] Date of Birth: May 18, 1981 Attending MD: Hennie Duos. Marletta Lor , [place], [id number] CSN: [id number] Age: 42 Admit Type: Inpatient Procedure:                Upper GI endoscopy Indications:              Acute post hemorrhagic anemia, Melena Providers:                Hennie Duos. Marletta Lor, DO, Angelica Ran, Elinor Parkinson Referring MD:              Medicines:                See the Anesthesia note for documentation of the                            administered medications Complications:            No immediate complications. Estimated Blood Loss:     Estimated blood loss was minimal. Procedure:                Pre-Anesthesia Assessment:                           - The anesthesia plan was to use monitored                            anesthesia care (MAC).                           After obtaining informed consent, the endoscope was                            passed under direct vision. Throughout the                            procedure, the patient's blood pressure, pulse, and                            oxygen saturations were monitored continuously. The                            [id number]-H190 ([id number]) scope was introduced through the                            mouth, and advanced to the second part of duodenum.                            The upper GI endoscopy was accomplished without                            difficulty. The patient tolerated the procedure                            well. Scope In: 1:06:51 PM Scope Out: 1:19:29 PM Total Procedure Duration: 0 hours 12 minutes 38 seconds  Findings:      The examined esophagus was normal.  Patchy mild inflammation characterized by erythema was found in the       gastric body and in the gastric antrum. Biopsies were taken with a cold       forceps for Helicobacter pylori testing.      An acquired benign-appearing, intrinsic severe stenosis was found  in the       duodenal sweep. Unable to traverse with regular endoscope. I switched to       ultra slim XP-[id number] endoscope and was able to traverse stricture.      One non-bleeding cratered duodenal ulcer with a clean ulcer base       (Forrest Class III) was found in the first portion of the duodenum. The       lesion was 8 mm in largest dimension. Impression:               - Normal esophagus.                           - Gastritis. Biopsied.                           - Acquired duodenal stenosis.                           - Non-bleeding duodenal ulcer with a clean ulcer                            base (Forrest Class III). Moderate Sedation:      Per Anesthesia Care Recommendation:           - Return patient to hospital ward for ongoing care.                           - Soft diet.                           - Use Protonix (pantoprazole) 40 mg PO BID.                           - No ibuprofen, naproxen, or other non-steroidal                            anti-inflammatory drugs.                           - Repeat upper endoscopy in 8-12 weeks to evaluate                            the response to therapy. Consider dilation of                            stricture at that time.                           - Return to GI office in 3-4 weeks.                           - Repeat CBC in 1 week. Procedure  Code(s):        --- Professional ---                           [phone], Esophagogastroduodenoscopy, flexible,                            transoral; with biopsy, single or multiple Diagnosis Code(s):        --- Professional ---                           K29.70, Gastritis, unspecified, without bleeding                           K31.5, Obstruction of duodenum                           K26.9, Duodenal ulcer, unspecified as acute or                            chronic, without hemorrhage or perforation                           D62, Acute posthemorrhagic anemia                           K92.1, Melena  (includes Hematochezia) CPT copyright 2022 American Medical Association. All rights reserved. The codes documented in this report are preliminary and upon coder review may  be revised to meet current compliance requirements. Hennie Duos. Marletta Lor, DO Hennie Duos. Marletta Lor, DO 05/29/2023 1:36:12 PM This report has been signed electronically. Number of Addenda: 0

## 2023-05-29 NOTE — H&P (Signed)
History and Physical    Patient: Jaclyn Thornton VZD:[id number] DOB: 12/17/1980 DOA: 05/29/2023 DOS: the patient was seen and examined on 05/29/2023 PCP: [facility]  Patient coming from: Home  Chief Complaint:  Chief Complaint  Patient presents with   Shortness of Breath   HPI: Jaclyn Thornton is a 42 y.o. female with medical history significant of migraines, class I obesity, prediabetes, hypertension and prior history of iron deficiency anemia.  Who presented to the hospital secondary to increased shortness of breath with exertion and generalized weakness.   Patient expressed noticing some changes in her stools (noticed tend to be darker in color) this had been happening intermittently.  Patient reports no frank blood seen and expressed that the last 72 hours prior to admission is when her symptoms of dyspnea on exertion has been more pronounced.  Patient reports no focal weakness, no fever, no chills, no productive coughing spells, no hematuria or dysuria and any other complaints.  Workup in the ED demonstrated negative troponin, chest x-ray without acute cardiopulmonary process and a CBC with hemoglobin down to 6.7 (1 month prior to admission blood work demonstrated hemoglobin of 11.6).    TRH has been consulted to place patient in the hospital for further evaluation and management.   Review of Systems: As mentioned in the history of present illness. All other systems reviewed and are negative. Past Medical History:  Diagnosis Date   Anemia    Ectopic pregnancy 2016   Migraines    Past Surgical History:  Procedure Laterality Date   DILATION AND CURETTAGE OF UTERUS     LAPAROSCOPIC BILATERAL SALPINGECTOMY Bilateral 07/23/2015   Procedure: LAPAROSCOPIC BILATERAL SALPINGECTOMY;  Surgeon: Tilda Burrow, MD;  Location: [facility];  Service: Gynecology;  Laterality: Bilateral;   TUBAL LIGATION  2001   Social History:  reports that she has never smoked. She has never  used smokeless tobacco. She reports current alcohol use. She reports that she does not use drugs.  Allergies  Allergen Reactions   Hydrocodone Nausea And Vomiting    Family History  Problem Relation Age of Onset   Diabetes Mother    Hypertension Mother    Diabetes Father    Hypertension Father    Breast cancer Maternal Aunt    Ovarian cancer Maternal Aunt    Breast cancer Paternal Aunt    Breast cancer Paternal Aunt    Dementia Paternal Grandmother    Diabetes Brother     Prior to Admission medications   Medication Sig Start Date End Date Taking? Authorizing Provider  fluconazole (DIFLUCAN) 150 MG tablet Take 1 now and 1 in 3 days 05/21/23   Adline Potter, NP  acetaminophen (TYLENOL) 500 MG tablet Take 500 mg by mouth every 6 (six) hours as needed for headache.     [provider]  amLODipine (NORVASC) 5 MG tablet Take 1 tablet (5 mg total) by mouth daily. 04/05/23   Cresenzo-Dishmon, Scarlette Calico, CNM  Aspirin-Salicylamide-Caffeine (BC HEADACHE POWDER PO) Take by mouth.    [provider]  diphenhydrAMINE HCl (BENADRYL PO) Take by mouth as needed.    [provider]    Physical Exam: Vitals:   05/29/23 0749 05/29/23 0757 05/29/23 0800 05/29/23 0822  BP:  121/83 133/63 133/63  Pulse:  88 95 95  Resp:  15 20 20   Temp:   98.3 F (36.8 C) 98.3 F (36.8 C)  TempSrc:    Oral  SpO2: 100% 100%  100%  Weight:  Height:       General exam: Alert, awake, oriented x 3; no chest pain and is stable vital signs while at rest. Respiratory system: Clear to auscultation. Respiratory effort normal.  Good saturation on room air. Cardiovascular system:RRR. No rubs or gallops. Gastrointestinal system: Abdomen is nondistended, soft and nontender. No organomegaly or masses felt. Normal bowel sounds heard. Central nervous system:No focal neurological deficits. Extremities: No C/C/E, +pedal pulses Skin: No rashes, lesions or ulcers Psychiatry: Judgement and  insight appear normal. Mood & affect appropriate.   Data Reviewed: Basic metabolic panel: Sodium 136, potassium 3.3, chloride 104, bicarb 25, BUN 9, creatinine 0.53, calcium 8.9 and GFR >60 CBC: White blood cell 7.8, hemoglobin 6.7 and platelet count 306K Iron studies: Collected and pending.  Assessment and Plan: 1-symptomatic anemia/GI bleed -Patient reporting melanotic stools -At baseline using NSAIDs as part of treatment for migraine -2 large IV requested; transfusion of 1 unit will be provided -PPI twice a day has been started -Patient NPO. -Avoiding heparin products; SCDs for DVT prophylaxis. -GI service consulted for assistance/endoscopy evaluation. -Continue supportive care.  2-class I obesity -Body mass index is 33.73 kg/m. -Low-calorie diet, portion control and increase physical activity discussed with patient.  3-hypokalemia -Potassium 3.4 at time of admission -Will check magnesium level -Replete electrolytes and follow trend.  4-hypertension -Holding antihypertensive agents orally at the moment in the setting of acute bleed -As needed hydralazine has been ordered.  5-prediabetes -Recent A1c 5.9 -Lifestyle changes, modified carbohydrate diet and weight management recommended. -Continue close outpatient follow-up.    Advance Care Planning:   Code Status: Full Code   Consults: GI service.  Family Communication: No family at bedside.  Severity of Illness: The appropriate patient status for this patient is OBSERVATION. Observation status is judged to be reasonable and necessary in order to provide the required intensity of service to ensure the patient's safety. The patient's presenting symptoms, physical exam findings, and initial radiographic and laboratory data in the context of their medical condition is felt to place them at decreased risk for further clinical deterioration. Furthermore, it is anticipated that the patient will be medically stable for discharge  from the hospital within 2 midnights of admission.   Author: Vassie Loll, MD 05/29/2023 8:34 AM  For on call review [email or web address].

## 2023-05-29 NOTE — ED Notes (Signed)
Lab at the bedside at this time to obtain type and screen - consent obtained by the patient and witnessed by this RN.

## 2023-05-29 NOTE — ED Notes (Signed)
ED TO INPATIENT HANDOFF REPORT  ED Nurse Name and Phone #:  Leanord Hawking, RN  S Name/Age/Gender Jaclyn Thornton 42 y.o. female Room/Bed: APA04/APA04  Code Status   Code Status: Not on file  Home/SNF/Other Home Patient oriented to: self, place, time, and situation Is this baseline? Yes   Triage Complete: Triage complete  Chief Complaint Symptomatic anemia [D64.9]  Triage Note Pt states she has been getting SOB with exertion since Saturday. C/o centralized chest pain, worse when taking deep breath and feels like she is having palpitations. Pt also c/o pain behind right ear.    Allergies Allergies  Allergen Reactions   Hydrocodone Nausea And Vomiting    Level of Care/Admitting Diagnosis ED Disposition     ED Disposition  Admit   Condition  --   Comment  Hospital Area: [facility] [[id number]]  Level of Care: Telemetry [5]  Covid Evaluation: Asymptomatic - no recent exposure (last 10 days) testing not required  Diagnosis: Symptomatic anemia [9604540]  Admitting Physician: Lilyan Gilford [[id number]]  Attending Physician: Lilyan Gilford [[id number]]          B Medical/Surgery History Past Medical History:  Diagnosis Date   Anemia    Ectopic pregnancy 2016   Migraines    Past Surgical History:  Procedure Laterality Date   DILATION AND CURETTAGE OF UTERUS     LAPAROSCOPIC BILATERAL SALPINGECTOMY Bilateral 07/23/2015   Procedure: LAPAROSCOPIC BILATERAL SALPINGECTOMY;  Surgeon: Tilda Burrow, MD;  Location: [facility];  Service: Gynecology;  Laterality: Bilateral;   TUBAL LIGATION  2001     A IV Location/Drains/Wounds Patient Lines/Drains/Airways Status     Active Line/Drains/Airways     Name Placement date Placement time Site Days   Peripheral IV 05/29/23 20 G Right Antecubital 05/29/23  0435  Antecubital  less than 1   Incision - 3 Ports Abdomen 1: Umbilicus 2: Mid;Upper 3: Mid;Upper;Lateral 07/23/15  1529  -- 2867            Intake/Output  Last 24 hours No intake or output data in the 24 hours ending 05/29/23 0657  Labs/Imaging Results for orders placed or performed during the hospital encounter of 05/29/23 (from the past 48 hour(s))  Basic metabolic panel     Status: Abnormal   Collection Time: 05/29/23  3:57 AM  Result Value Ref Range   Sodium 136 135 - 145 mmol/L   Potassium 3.3 (L) 3.5 - 5.1 mmol/L   Chloride 104 98 - 111 mmol/L   CO2 25 22 - 32 mmol/L   Glucose, Bld 119 (H) 70 - 99 mg/dL    Comment: Glucose reference range applies only to samples taken after fasting for at least 8 hours.   BUN 9 6 - 20 mg/dL   Creatinine, Ser 1.30 0.44 - 1.00 mg/dL   Calcium 8.9 8.9 - 86.5 mg/dL   GFR, Estimated >78 >46 mL/min    Comment: (NOTE) Calculated using the CKD-EPI Creatinine Equation (2021)    Anion gap 7 5 - 15    Comment: Performed at [facility], [address] [place], [place] [address]  CBC     Status: Abnormal   Collection Time: 05/29/23  3:57 AM  Result Value Ref Range   WBC 7.8 4.0 - 10.5 K/uL   RBC 2.34 (L) 3.87 - 5.11 MIL/uL   Hemoglobin 6.7 (LL) 12.0 - 15.0 g/dL    Comment: REPEATED TO VERIFY THIS CRITICAL RESULT HAS VERIFIED AND BEEN CALLED TO MACI MOSTELLER BY Mardene Celeste  VIRAY ON 11 12 2024 AT 0416, AND HAS BEEN READ BACK.  THIS CRITICAL RESULT HAS VERIFIED AND BEEN CALLED TO MACI MOSTELLER BY JOANNA VIRAY ON 11 12 2024 AT 0419, AND HAS BEEN READ BACK.     HCT 21.2 (L) 36.0 - 46.0 %   MCV 90.6 80.0 - 100.0 fL   MCH 28.6 26.0 - 34.0 pg   MCHC 31.6 30.0 - 36.0 g/dL   RDW 40.9 (H) 81.1 - 91.4 %   Platelets 306 150 - 400 K/uL   nRBC 0.9 (H) 0.0 - 0.2 %    Comment: Performed at [facility], [address], [place], [place] [address]  Troponin I (High Sensitivity)     Status: None   Collection Time: 05/29/23  3:57 AM  Result Value Ref Range   Troponin I (High Sensitivity) 2 <18 ng/L    Comment: (NOTE) Elevated high sensitivity troponin I (hsTnI) values and significant  changes across serial measurements  may suggest ACS but many other  chronic and acute conditions are known to elevate hsTnI results.  Refer to the "Links" section for chest pain algorithms and additional  guidance. Performed at [facility], [address], [place], [place] [address]   Type and screen [facility]     Status: None (Preliminary result)   Collection Time: 05/29/23  5:57 AM  Result Value Ref Range   ABO/RH(D) A POS    Antibody Screen PENDING    Sample Expiration      06/01/2023,2359 Performed at [facility], [address] [place], [place] [address]   Prepare RBC (crossmatch)     Status: None   Collection Time: 05/29/23  5:57 AM  Result Value Ref Range   Order Confirmation      ORDER PROCESSED BY BLOOD BANK Performed at [facility], [address], [place], [place] [address]   Troponin I (High Sensitivity)     Status: None   Collection Time: 05/29/23  5:57 AM  Result Value Ref Range   Troponin I (High Sensitivity) 3 <18 ng/L    Comment: (NOTE) Elevated high sensitivity troponin I (hsTnI) values and significant  changes across serial measurements may suggest ACS but many other  chronic and acute conditions are known to elevate hsTnI results.  Refer to the "Links" section for chest pain algorithms and additional  guidance. Performed at [facility], [address] [place], [place] [address]    No results found.  Pending Labs Unresulted Labs (From admission, onward)     Start     Ordered   05/29/23 0433  Iron and TIBC  Once,   URGENT        05/29/23 0432   05/29/23 0433  Ferritin (Iron Binding Protein)  Once,   URGENT        05/29/23 0432            Vitals/Pain Today's Vitals   05/29/23 0403 05/29/23 0415 05/29/23 0500 05/29/23 0630  BP: (!) 131/90 112/82 124/81 130/77  Pulse: 82 82 88 82  Resp: 19 13 15 16   Temp: 98.2 F (36.8 C)   98 F (36.7 C)  TempSrc: Oral     SpO2: 99% 99% 97% 98%  Weight:      Height:      PainSc:        Isolation Precautions No active  isolations  Medications Medications  0.9 %  sodium chloride infusion (Manually program via Guardrails IV Fluids) (0 mLs Intravenous Hold 05/29/23 0436)  pantoprazole (PROTONIX) injection 40 mg (40 mg Intravenous Given 05/29/23 0443)    Mobility walks     Focused Assessments Pulmonary Assessment Handoff:  Lung sounds:   O2 Device: Room Air      R Recommendations: See Admitting Provider Note  Report given to:   Additional Notes: N/A

## 2023-05-29 NOTE — Transfer of Care (Signed)
Immediate Anesthesia Transfer of Care Note  Patient: Jaclyn Thornton  Procedure(s) Performed: ESOPHAGOGASTRODUODENOSCOPY (EGD) WITH PROPOFOL BIOPSY  Patient Location: Short Stay  Anesthesia Type:General  Level of Consciousness: awake, alert , oriented, and patient cooperative  Airway & Oxygen Therapy: Patient Spontanous Breathing  Post-op Assessment: Report given to RN, Post -op Vital signs reviewed and stable, and Patient moving all extremities X 4  Post vital signs: Reviewed and stable  Last Vitals:  Vitals Value Taken Time  BP 114/83 05/29/23 1330  Temp 36.7 C 05/29/23 1324  Pulse 88 05/29/23 1341  Resp 21 05/29/23 1341  SpO2 99 % 05/29/23 1341  Vitals shown include unfiled device data.  Last Pain:  Vitals:   05/29/23 1330  TempSrc:   PainSc: 0-No pain         Complications: No notable events documented.

## 2023-05-29 NOTE — Anesthesia Preprocedure Evaluation (Addendum)
Anesthesia Evaluation  Patient identified by MRN, date of birth, ID band Patient awake    Reviewed: Allergy & Precautions, H&P , NPO status , Patient's Chart, lab work & pertinent test results, reviewed documented beta blocker date and time   Airway Mallampati: II  TM Distance: >3 FB Neck ROM: full    Dental no notable dental hx. (+) Teeth Intact, Dental Advisory Given   Pulmonary neg pulmonary ROS   Pulmonary exam normal breath sounds clear to auscultation       Cardiovascular Exercise Tolerance: Good hypertension, Normal cardiovascular exam Rhythm:Regular Rate:Normal     Neuro/Psych  Headaches negative neurological ROS  negative psych ROS   GI/Hepatic Neg liver ROS,GERD  ,,  Endo/Other  negative endocrine ROS    Renal/GU negative Renal ROS  negative genitourinary   Musculoskeletal   Abdominal   Peds  Hematology  (+) Blood dyscrasia, anemia   Anesthesia Other Findings Severe anemia.  Looking for cause  Reproductive/Obstetrics negative OB ROS                             Anesthesia Physical Anesthesia Plan  ASA: 3 and emergent  Anesthesia Plan: General   Post-op Pain Management: Minimal or no pain anticipated   Induction: Intravenous, Rapid sequence and Cricoid pressure planned  PONV Risk Score and Plan: Propofol infusion  Airway Management Planned: Nasal Cannula and Natural Airway  Additional Equipment: None  Intra-op Plan:   Post-operative Plan:   Informed Consent: I have reviewed the patients History and Physical, chart, labs and discussed the procedure including the risks, benefits and alternatives for the proposed anesthesia with the patient or authorized representative who has indicated his/her understanding and acceptance.     Dental Advisory Given  Plan Discussed with: CRNA  Anesthesia Plan Comments:         Anesthesia Quick Evaluation

## 2023-05-29 NOTE — Progress Notes (Signed)
   05/29/23 1047  TOC Brief Assessment  Insurance and Status Reviewed  Patient has primary care physician Yes  Home environment has been reviewed Home with children  Prior level of function: Independent  Prior/Current Home Services No current home services  Social Determinants of Health Reivew SDOH reviewed no interventions necessary  Readmission risk has been reviewed Yes  Transition of care needs no transition of care needs at this time   Scheduled for EGD today.   Transition of Care Department [facility]) has reviewed patient and no TOC needs have been identified at this time. We will continue to monitor patient advancement through interdisciplinary progression rounds. If new patient transition needs arise, please place a TOC consult.

## 2023-05-29 NOTE — ED Triage Notes (Signed)
Pt states she has been getting SOB with exertion since Saturday. C/o centralized chest pain, worse when taking deep breath and feels like she is having palpitations. Pt also c/o pain behind right ear.

## 2023-05-29 NOTE — ED Provider Notes (Signed)
[place] EMERGENCY DEPARTMENT AT [facility] Provider Note  CSN: [id number] Arrival date & time: 05/29/23 4098  Chief Complaint(s) Shortness of Breath  HPI Jaclyn Thornton is a 42 y.o. female with PMH iron deficiency anemia on iron, ectopic pregnancy, migraines who presents emergency department for evaluation of exertional shortness of breath.  She states that she has noticed some stools that have been darker than normal over the last 72 hours but over the last 24 hours has gotten significantly more short of breath on exertion with generalized fatigue.  Denies associated chest pain, abdominal pain, nausea, vomiting, headache, fever or other systemic symptoms.  Denies heavy vaginal bleeding.  Denies blood thinner use.   Past Medical History Past Medical History:  Diagnosis Date   Anemia    Ectopic pregnancy 2016   Migraines    Patient Active Problem List   Diagnosis Date Noted   Hypertension 03/09/2021   Intractable menstrual migraine without status migrainosus 06/29/2016   S/P ectopic pregnancy 08/03/2015   Home Medication(s) Prior to Admission medications   Medication Sig Start Date End Date Taking? Authorizing Provider  fluconazole (DIFLUCAN) 150 MG tablet Take 1 now and 1 in 3 days 05/21/23   Adline Potter, NP  acetaminophen (TYLENOL) 500 MG tablet Take 500 mg by mouth every 6 (six) hours as needed for headache.     [provider]  amLODipine (NORVASC) 5 MG tablet Take 1 tablet (5 mg total) by mouth daily. 04/05/23   Cresenzo-Dishmon, Scarlette Calico, CNM  Aspirin-Salicylamide-Caffeine (BC HEADACHE POWDER PO) Take by mouth.    [provider]  diphenhydrAMINE HCl (BENADRYL PO) Take by mouth as needed.    [provider]                                                                                                                                    Past Surgical History Past Surgical History:  Procedure Laterality Date   DILATION AND  CURETTAGE OF UTERUS     LAPAROSCOPIC BILATERAL SALPINGECTOMY Bilateral 07/23/2015   Procedure: LAPAROSCOPIC BILATERAL SALPINGECTOMY;  Surgeon: Tilda Burrow, MD;  Location: [facility];  Service: Gynecology;  Laterality: Bilateral;   TUBAL LIGATION  2001   Family History Family History  Problem Relation Age of Onset   Diabetes Mother    Hypertension Mother    Diabetes Father    Hypertension Father    Breast cancer Maternal Aunt    Ovarian cancer Maternal Aunt    Breast cancer Paternal Aunt    Breast cancer Paternal Aunt    Dementia Paternal Grandmother    Diabetes Brother     Social History Social History   Tobacco Use   Smoking status: Never   Smokeless tobacco: Never  Vaping Use   Vaping status: Never Used  Substance Use Topics   Alcohol use: Yes    Alcohol/week: 0.0 standard drinks of alcohol    Comment:  occ   Drug use: No   Allergies Hydrocodone  Review of Systems Review of Systems  Constitutional:  Positive for fatigue.  Respiratory:  Positive for shortness of breath.     Physical Exam Vital Signs  I have reviewed the triage vital signs BP 112/82   Pulse 82   Temp 98.2 F (36.8 C) (Oral)   Resp 13   Ht 5\' 6"  (1.676 m)   Wt 94.8 kg   SpO2 99%   BMI 33.73 kg/m   Physical Exam Vitals and nursing note reviewed.  Constitutional:      General: She is not in acute distress.    Appearance: She is well-developed.  HENT:     Head: Normocephalic and atraumatic.  Eyes:     Conjunctiva/sclera: Conjunctivae normal.  Cardiovascular:     Rate and Rhythm: Normal rate and regular rhythm.     Heart sounds: No murmur heard. Pulmonary:     Effort: Pulmonary effort is normal. No respiratory distress.     Breath sounds: Normal breath sounds.  Abdominal:     Palpations: Abdomen is soft.     Tenderness: There is no abdominal tenderness.  Musculoskeletal:        General: No swelling.     Cervical back: Neck supple.  Skin:    General: Skin is warm and dry.      Capillary Refill: Capillary refill takes less than 2 seconds.     Coloration: Skin is pale.  Neurological:     Mental Status: She is alert.  Psychiatric:        Mood and Affect: Mood normal.     ED Results and Treatments Labs (all labs ordered are listed, but only abnormal results are displayed) Labs Reviewed  BASIC METABOLIC PANEL - Abnormal; Notable for the following components:      Result Value   Potassium 3.3 (*)    Glucose, Bld 119 (*)    All other components within normal limits  CBC - Abnormal; Notable for the following components:   RBC 2.34 (*)    Hemoglobin 6.7 (*)    HCT 21.2 (*)    RDW 18.6 (*)    nRBC 0.9 (*)    All other components within normal limits  IRON AND TIBC  FERRITIN  TYPE AND SCREEN  PREPARE RBC (CROSSMATCH)  TROPONIN I (HIGH SENSITIVITY)  TROPONIN I (HIGH SENSITIVITY)                                                                                                                          Radiology No results found.  Pertinent labs & imaging results that were available during my care of the patient were reviewed by me and considered in my medical decision making (see MDM for details).  Medications Ordered in ED Medications  0.9 %  sodium chloride infusion (Manually program via Guardrails IV Fluids) (0 mLs Intravenous Hold 05/29/23 0436)  pantoprazole (PROTONIX) injection 40 mg (40 mg Intravenous Given  05/29/23 0443)                                                                                                                                     Procedures .Critical Care  Performed by: Glendora Score, MD Authorized by: Glendora Score, MD   Critical care provider statement:    Critical care time (minutes):  30   Critical care was necessary to treat or prevent imminent or life-threatening deterioration of the following conditions: Critical anemia requiring blood transfusion.   Critical care was time spent personally by me on the following  activities:  Development of treatment plan with patient or surrogate, discussions with consultants, evaluation of patient's response to treatment, examination of patient, ordering and review of laboratory studies, ordering and review of radiographic studies, ordering and performing treatments and interventions, pulse oximetry, re-evaluation of patient's condition and review of old charts   (including critical care time)  Medical Decision Making / ED Course   This patient presents to the ED for concern of shortness of breath, this involves an extensive number of treatment options, and is a complaint that carries with it a high risk of complications and morbidity.  The differential diagnosis includes Pe, PTX, Pulmonary Edema, ARDS, COPD/Asthma, ACS, CHF exacerbation, Arrhythmia, Pericardial Effusion/Tamponade, Anemia, Sepsis, Acidosis/Hypercapnia, Anxiety, Viral URI  MDM: Patient seen emergency room for evaluation of shortness of breath.  Physical exam reveals skin pallor but is otherwise unremarkable.  Laboratory evaluation with a new hemoglobin drop from a baseline of 11.35-month ago to 6.7 today.  Mild hypokalemia to 3.3.  High-sensitivity troponin unremarkable.  Chest x-ray with no acute pathology.  Rectal exam deferred as the patient is currently on iron and this would likely lead to a positive result regardless and thus does not change management.  This invasive test and as it will not change management we will defer this.  Packed blood cells ordered for the patient and PPI started empirically.  Iron studies ordered and patient require hospital admission for symptomatic anemia.   Additional history obtained:  -External records from outside source obtained and reviewed including: Chart review including previous notes, labs, imaging, consultation notes   Lab Tests: -I ordered, reviewed, and interpreted labs.   The pertinent results include:   Labs Reviewed  BASIC METABOLIC PANEL - Abnormal;  Notable for the following components:      Result Value   Potassium 3.3 (*)    Glucose, Bld 119 (*)    All other components within normal limits  CBC - Abnormal; Notable for the following components:   RBC 2.34 (*)    Hemoglobin 6.7 (*)    HCT 21.2 (*)    RDW 18.6 (*)    nRBC 0.9 (*)    All other components within normal limits  IRON AND TIBC  FERRITIN  TYPE AND SCREEN  PREPARE RBC (CROSSMATCH)  TROPONIN I (HIGH SENSITIVITY)  TROPONIN I (HIGH SENSITIVITY)  EKG   EKG Interpretation Date/Time:  Tuesday May 29 2023 03:22:04 EST Ventricular Rate:  92 PR Interval:  140 QRS Duration:  78 QT Interval:  372 QTC Calculation: 460 R Axis:   35  Text Interpretation: Normal sinus rhythm No previous ECGs available Confirmed by Vidyuth Belsito ([id number]) on 05/29/2023 5:42:08 AM         Imaging Studies ordered: I ordered imaging studies including chest x-ray I independently visualized and interpreted imaging. I agree with the radiologist interpretation   Medicines ordered and prescription drug management: Meds ordered this encounter  Medications   0.9 %  sodium chloride infusion (Manually program via Guardrails IV Fluids)   pantoprazole (PROTONIX) injection 40 mg    -I have reviewed the patients home medicines and have made adjustments as needed  Critical interventions Packed red blood cells   Cardiac Monitoring: The patient was maintained on a cardiac monitor.  I personally viewed and interpreted the cardiac monitored which showed an underlying rhythm of: NSR  Social Determinants of Health:  Factors impacting patients care include: none   Reevaluation: After the interventions noted above, I reevaluated the patient and found that they have :stayed the same  Co morbidities that complicate the patient evaluation  Past Medical History:  Diagnosis Date   Anemia    Ectopic pregnancy 2016   Migraines       Dispostion: I considered admission for this  patient, and patient require hospital admission for symptomatic anemia requiring blood transfusion.     Final Clinical Impression(s) / ED Diagnoses Final diagnoses:  Symptomatic anemia     @PCDICTATION @    Glendora Score, MD 05/29/23 1907

## 2023-05-29 NOTE — Consult Note (Addendum)
@LOGO @   Referring Provider: [facility]  Primary Care Physician:  [facility], [facility] Primary Gastroenterologist:  Dr. Marletta Lor (previously unassigned)   Date of Admission: 05/29/23 Date of Consultation: 05/29/23  Reason for Consultation:  Anemia  HPI:  Jaclyn Thornton is a 42 y.o. year old female with history of iron deficiency anemia on iron, ectopic pregnancy, migraines who presented to the ER today for evaluation of exertional shortness of breath. Also reported darker than normal stools for the last 72 hours.   ED Course:  She has been hemodynamically stable.  She was found to have a Hgb of 6.7, down from 11.6, 1 month ago. Also with iron deficiency.  She was given IV Protonix  I unit PRBCs ordered    Today:  SOB/weakness starting Saturday.  Stools a bit darker last Thursday-Saturday. States it is more normal since yesterday. Chropnically somewhat dark as she is on iron.  Denies abdominal pain, nausea, vomiting.  She does have bad heartburn. Worse with spicy foods. Takes Rolaids daily.  No dysphasia.  Hasn't had much of an appetite this past week.    NSAIDs: Takes Goody powders daily to every other day for headaches., sometimes less. Has been doing this for "a while".  Blood thinners: None.   No bright red blood per rectum.  Prior EGD: Reports having an EGD in her 75s in [place] and was told she had scarring in her esophagus from reflux. Was on Nexium for a little while after this.  Prior colonoscopy: Never  Last po intake: Yesterday.    Reports she has had anemia since age 64-16.  Has been on iron intermittent since then. No heavy bleeding since her 40s. Last about 3 days. Just got off her period on Thursday.   Fhx: No colon cancer, esophageal cancer, or stomach cancer.  Past Medical History:  Diagnosis Date   Anemia    Ectopic pregnancy 2016   Migraines     Past Surgical History:  Procedure Laterality Date   DILATION AND CURETTAGE OF UTERUS      LAPAROSCOPIC BILATERAL SALPINGECTOMY Bilateral 07/23/2015   Procedure: LAPAROSCOPIC BILATERAL SALPINGECTOMY;  Surgeon: Tilda Burrow, MD;  Location: [facility];  Service: Gynecology;  Laterality: Bilateral;   TUBAL LIGATION  2001    Prior to Admission medications   Medication Sig Start Date End Date Taking? Authorizing Provider  fluconazole (DIFLUCAN) 150 MG tablet Take 1 now and 1 in 3 days 05/21/23   Adline Potter, NP  acetaminophen (TYLENOL) 500 MG tablet Take 500 mg by mouth every 6 (six) hours as needed for headache.     [provider]  amLODipine (NORVASC) 5 MG tablet Take 1 tablet (5 mg total) by mouth daily. 04/05/23   Cresenzo-Dishmon, Scarlette Calico, CNM  Aspirin-Salicylamide-Caffeine (BC HEADACHE POWDER PO) Take by mouth.    [provider]  diphenhydrAMINE HCl (BENADRYL PO) Take by mouth as needed.    [provider]    Current Facility-Administered Medications  Medication Dose Route Frequency Provider Last Rate Last Admin   0.9 %  sodium chloride infusion (Manually program via Guardrails IV Fluids)   Intravenous Once Kommor, Wyn Forster, MD   Held at 05/29/23 0436   0.9 %  sodium chloride infusion  250 mL Intravenous PRN Vassie Loll, MD       acetaminophen (TYLENOL) tablet 650 mg  650 mg Oral Q6H PRN Vassie Loll, MD       Or   acetaminophen (TYLENOL) suppository 650 mg  650  mg Rectal Q6H PRN Vassie Loll, MD       hydrALAZINE (APRESOLINE) injection 10 mg  10 mg Intravenous Q8H PRN Vassie Loll, MD       ondansetron [facility]) tablet 4 mg  4 mg Oral Q6H PRN Vassie Loll, MD       Or   ondansetron [facility]) injection 4 mg  4 mg Intravenous Q6H PRN Vassie Loll, MD       pantoprazole (PROTONIX) injection 40 mg  40 mg Intravenous Q12H Vassie Loll, MD       sodium chloride flush (NS) 0.9 % injection 3 mL  3 mL Intravenous Q12H Vassie Loll, MD       sodium chloride flush (NS) 0.9 % injection 3 mL  3 mL Intravenous PRN Vassie Loll, MD         Allergies as of 05/29/2023 - Review Complete 05/29/2023  Allergen Reaction Noted   Hydrocodone Nausea And Vomiting 08/25/2022    Family History  Problem Relation Age of Onset   Diabetes Mother    Hypertension Mother    Diabetes Father    Hypertension Father    Breast cancer Maternal Aunt    Ovarian cancer Maternal Aunt    Breast cancer Paternal Aunt    Breast cancer Paternal Aunt    Dementia Paternal Grandmother    Diabetes Brother     Social History   Socioeconomic History   Marital status: Single    Spouse name: Not on file   Number of children: 2   Years of education: Not on file   Highest education level: GED or equivalent  Occupational History   Not on file  Tobacco Use   Smoking status: Never   Smokeless tobacco: Never  Vaping Use   Vaping status: Never Used  Substance and Sexual Activity   Alcohol use: Yes    Alcohol/week: 0.0 standard drinks of alcohol    Comment: occ   Drug use: No   Sexual activity: Yes    Birth control/protection: Surgical    Comment: tubal  Other Topics Concern   Not on file  Social History Narrative   Not on file   Social Determinants of Health   Financial Resource Strain: Medium Risk (04/05/2023)   Overall Financial Resource Strain (CARDIA)    Difficulty of Paying Living Expenses: Somewhat hard  Food Insecurity: Food Insecurity Present (04/05/2023)   Hunger Vital Sign    Worried About Running Out of Food in the Last Year: Sometimes true    Ran Out of Food in the Last Year: Sometimes true  Transportation Needs: No Transportation Needs (04/05/2023)   PRAPARE - Administrator, Civil Service (Medical): No    Lack of Transportation (Non-Medical): No  Physical Activity: Sufficiently Active (04/05/2023)   Exercise Vital Sign    Days of Exercise per Week: 4 days    Minutes of Exercise per Session: 60 min  Stress: No Stress Concern Present (04/05/2023)   [employer] of Occupational Health - Occupational Stress  Questionnaire    Feeling of Stress : Only a little  Social Connections: Socially Isolated (04/05/2023)   Social Connection and Isolation Panel [NHANES]    Frequency of Communication with Friends and Family: More than three times a week    Frequency of Social Gatherings with Friends and Family: More than three times a week    Attends Religious Services: Never    Database administrator or Organizations: No    Attends Banker Meetings:  Never    Marital Status: Never married  Intimate Partner Violence: Not At Risk (04/05/2023)   Humiliation, Afraid, Rape, and Kick questionnaire    Fear of Current or Ex-Partner: No    Emotionally Abused: No    Physically Abused: No    Sexually Abused: No    Review of Systems: Gen: Denies fever, chills, cold or flu like symptoms, pre-syncope, or syncope.  CV: Denies chest pain, heart palpitations. Resp: Denies shortness of breath at rest.  GI: See HPI GU : Denies urinary burning, urinary frequency, urinary incontinence.  MS: Denies joint pain. Derm: Denies rash. Psych: Denies depression, anxiety. Heme: See HPI  Physical Exam: Vital signs in last 24 hours: Temp:  [98 F (36.7 C)-98.4 F (36.9 C)] 98.3 F (36.8 C) (11/12 0822) Pulse Rate:  [82-95] 95 (11/12 0822) Resp:  [13-20] 20 (11/12 0822) BP: (112-137)/(63-96) 133/63 (11/12 0822) SpO2:  [97 %-100 %] 100 % (11/12 0822) Weight:  [94.8 kg] 94.8 kg (11/12 0320)   General:   Alert,  Well-developed, well-nourished, pleasant and cooperative in NAD Head:  Normocephalic and atraumatic. Eyes:  Sclera clear, no icterus.   Conjunctiva pink. Ears:  Normal auditory acuity. Lungs:  Clear throughout to auscultation.   No wheezes, crackles, or rhonchi. No acute distress. Heart:  Regular rate and rhythm; no murmurs, clicks, rubs,  or gallops. Abdomen:  Soft, nontender and nondistended. No masses, hepatosplenomegaly or hernias noted. Normal bowel sounds, without guarding, and without rebound.    Rectal:  Deferred  Msk:  Symmetrical without gross deformities. Normal posture. Extremities:  Without edema. Neurologic:  Alert and  oriented x4;  grossly normal neurologically. Skin:  Intact without significant lesions or rashes. Psych:  Normal mood and affect.  Intake/Output from previous day: No intake/output data recorded. Intake/Output this shift: No intake/output data recorded.  Lab Results: Recent Labs    05/29/23 0357  WBC 7.8  HGB 6.7*  HCT 21.2*  PLT 306   BMET Recent Labs    05/29/23 0357  NA 136  K 3.3*  CL 104  CO2 25  GLUCOSE 119*  BUN 9  CREATININE 0.53  CALCIUM 8.9    Studies/Results: DG Chest 2 View  Result Date: 05/29/2023 CLINICAL DATA:  42 year old female with history of shortness of breath and weakness. EXAM: CHEST - 2 VIEW COMPARISON:  No priors. FINDINGS: Lung volumes are normal. No consolidative airspace disease. No pleural effusions. No pneumothorax. No pulmonary nodule or mass noted. Pulmonary vasculature and the cardiomediastinal silhouette are within normal limits. IMPRESSION: No radiographic evidence of acute cardiopulmonary disease. Electronically Signed   By: Trudie Reed M.D.   On: 05/29/2023 07:00    Impression: 42 y.o. year old female with history of iron deficiency anemia on iron, ectopic pregnancy, migraines who presented to the ER today for evaluation of exertional shortness of breath, also reported darker than normal stools for the last 72 hours, and was found to have Hgb of 6.7, now admitted for symptomatic anemia. GI consulted for further evaluation.  Symptomatic anemia with melena: Hemoglobin 6.7 on admission, down from 11.6, 1 month ago.  She is chronically on oral iron for chronic history of iron deficiency anemia but denies ever having overt GI bleeding until she developed darker than normal stools last Thursday lasting until Saturday with stools returning to normal yesterday.  She is a high risk for peptic ulcer disease  in the setting of frequent use of Goody powders.  Additional considerations include gastritis, duodenitis, esophagitis, Dieulafoy lesion,  and/or bleeding AMVs. As she has never had a colonoscopy, unable to rule out right sided colonic lesion, but I consider this less likely.  Plan: Keep NPO Agree with transfusing 1 unit PRBCs (currently transfusing). Repeat H/H post transfusion.  EGD with propofol with Dr. Marletta Lor today following PRBCs. The risks, benefits, and alternatives have been discussed with the patient in detail. The patient states understanding and desires to proceed.  IV Protonix BID.  NSAIDs avoidance.  Further recommendations following EGD.     LOS: 0 days    05/29/2023, 8:37 AM   Ermalinda Memos, PA-C [facility]

## 2023-05-30 ENCOUNTER — Other Ambulatory Visit: Payer: Self-pay

## 2023-05-30 ENCOUNTER — Telehealth (INDEPENDENT_AMBULATORY_CARE_PROVIDER_SITE_OTHER): Payer: Self-pay | Admitting: Gastroenterology

## 2023-05-30 DIAGNOSIS — K315 Obstruction of duodenum: Secondary | ICD-10-CM | POA: Diagnosis not present

## 2023-05-30 DIAGNOSIS — K2970 Gastritis, unspecified, without bleeding: Secondary | ICD-10-CM | POA: Diagnosis not present

## 2023-05-30 DIAGNOSIS — K921 Melena: Secondary | ICD-10-CM

## 2023-05-30 DIAGNOSIS — K269 Duodenal ulcer, unspecified as acute or chronic, without hemorrhage or perforation: Secondary | ICD-10-CM

## 2023-05-30 DIAGNOSIS — Z862 Personal history of diseases of the blood and blood-forming organs and certain disorders involving the immune mechanism: Secondary | ICD-10-CM

## 2023-05-30 DIAGNOSIS — D649 Anemia, unspecified: Secondary | ICD-10-CM | POA: Diagnosis not present

## 2023-05-30 LAB — CBC
HCT: 24.9 % — ABNORMAL LOW (ref 36.0–46.0)
Hemoglobin: 7.7 g/dL — ABNORMAL LOW (ref 12.0–15.0)
MCH: 27.9 pg (ref 26.0–34.0)
MCHC: 30.9 g/dL (ref 30.0–36.0)
MCV: 90.2 fL (ref 80.0–100.0)
Platelets: 337 10*3/uL (ref 150–400)
RBC: 2.76 MIL/uL — ABNORMAL LOW (ref 3.87–5.11)
RDW: 19 % — ABNORMAL HIGH (ref 11.5–15.5)
WBC: 8.3 10*3/uL (ref 4.0–10.5)
nRBC: 0.4 % — ABNORMAL HIGH (ref 0.0–0.2)

## 2023-05-30 LAB — BASIC METABOLIC PANEL
Anion gap: 5 (ref 5–15)
BUN: 11 mg/dL (ref 6–20)
CO2: 24 mmol/L (ref 22–32)
Calcium: 8.2 mg/dL — ABNORMAL LOW (ref 8.9–10.3)
Chloride: 107 mmol/L (ref 98–111)
Creatinine, Ser: 0.63 mg/dL (ref 0.44–1.00)
GFR, Estimated: >60 mL/min (ref >60)
Glucose, Bld: 101 mg/dL — ABNORMAL HIGH (ref 70–99)
Potassium: 3.7 mmol/L (ref 3.5–5.1)
Sodium: 136 mmol/L (ref 135–145)

## 2023-05-30 LAB — BPAM RBC
Blood Product Expiration Date: 202412012359
ISSUE DATE / TIME: 202411120740
Unit Type and Rh: 6200

## 2023-05-30 LAB — TYPE AND SCREEN
ABO/RH(D): A POS
Antibody Screen: NEGATIVE
Unit division: 0

## 2023-05-30 LAB — MAGNESIUM: Magnesium: 2.4 mg/dL (ref 1.7–2.4)

## 2023-05-30 MED ORDER — SUMATRIPTAN SUCCINATE 100 MG PO TABS: 100.0000 mg | ORAL_TABLET | Freq: Once | ORAL | 2 refills | Status: AC | PRN

## 2023-05-30 MED ORDER — SUCRALFATE 1 G PO TABS: 1.0000 g | ORAL_TABLET | Freq: Four times a day (QID) | ORAL | 0 refills | Status: DC

## 2023-05-30 MED ORDER — PANTOPRAZOLE SODIUM 40 MG PO TBEC: 40.0000 mg | DELAYED_RELEASE_TABLET | Freq: Two times a day (BID) | ORAL | 3 refills | Status: AC

## 2023-05-30 MED ORDER — AMLODIPINE BESYLATE 5 MG PO TABS: 5.0000 mg | ORAL_TABLET | Freq: Every day | ORAL | 4 refills | Status: DC

## 2023-05-30 NOTE — Telephone Encounter (Signed)
Appt was made, lab was ordered and pt was made aware and verbalized understanding.

## 2023-05-30 NOTE — Plan of Care (Signed)
  Problem: Education: Goal: Knowledge of General Education information will improve Description Including pain rating scale, medication(s)/side effects and non-pharmacologic comfort measures Outcome: Progressing   

## 2023-05-30 NOTE — Discharge Instructions (Signed)
1)Avoid ibuprofen/Advil/Aleve/Motrin/Goody Powders/Naproxen/BC powders/Meloxicam/Diclofenac/Indomethacin and other Nonsteroidal anti-inflammatory medications as these will make you more likely to bleed and can cause stomach ulcers, can also cause Kidney problems.   2)Please avoid coffee/caffeinated beverages, Tea, chocolate, carbonated drinks/soda, spicy food, Milk, Please Avoid  Alcohol, acidic foods- such as citrus (Lemon, oranges), juices  and tomatoes, Meats with a high fat content. Avoid High-fat condiments and  Fried foods  3)Please Follow up with Gastroenterologist Dr. Levon Hedger-- address [address], [address], [place] [address],,UEAVW Number [phone]  in 1 to 2 weeks for Repeat CBC Blood test

## 2023-05-30 NOTE — Discharge Summary (Signed)
Jaclyn Thornton, is a 42 y.o. female  DOB Apr 08, 1981  MRN [id number].  Admission date:  05/29/2023  Admitting Physician  Lilyan Gilford, DO  Discharge Date:  05/30/2023   Primary MD  [facility], [facility]  Recommendations for primary care physician for things to follow:  1)Avoid ibuprofen/Advil/Aleve/Motrin/Goody Powders/Naproxen/BC powders/Meloxicam/Diclofenac/Indomethacin and other Nonsteroidal anti-inflammatory medications as these will make you more likely to bleed and can cause stomach ulcers, can also cause Kidney problems.   2)Please avoid coffee/caffeinated beverages, Tea, chocolate, carbonated drinks/soda, spicy food, Milk, Please Avoid  Alcohol, acidic foods- such as citrus (Lemon, oranges), juices  and tomatoes, Meats with a high fat content. Avoid High-fat condiments and  Fried foods  3)Please Follow up with Gastroenterologist Dr. Levon Hedger-- address [address], [address], [place] [address],,XBJYN Number [phone]  in 1 to 2 weeks for Repeat CBC Blood test  Admission Diagnosis  Symptomatic anemia [D64.9]   Discharge Diagnosis  Symptomatic anemia [D64.9]    Principal Problem:   Symptomatic anemia Active Problems:   Melena   ABLA (acute blood loss anemia)   NSAID long-term use   Gastroesophageal reflux disease     Past Medical History:  Diagnosis Date   Anemia    Ectopic pregnancy 2016   Migraines     Past Surgical History:  Procedure Laterality Date   DILATION AND CURETTAGE OF UTERUS     LAPAROSCOPIC BILATERAL SALPINGECTOMY Bilateral 07/23/2015   Procedure: LAPAROSCOPIC BILATERAL SALPINGECTOMY;  Surgeon: Tilda Burrow, MD;  Location: [facility];  Service: Gynecology;  Laterality: Bilateral;   TUBAL LIGATION  2001     HPI  from the history and physical done on the day of admission:   HPI: Jaclyn Thornton is a 42 y.o. female with medical history significant of  migraines, class I obesity, prediabetes, hypertension and prior history of iron deficiency anemia.  Who presented to the hospital secondary to increased shortness of breath with exertion and generalized weakness.   Patient expressed noticing some changes in her stools (noticed tend to be darker in color) this had been happening intermittently.  Patient reports no frank blood seen and expressed that the last 72 hours prior to admission is when her symptoms of dyspnea on exertion has been more pronounced.   Patient reports no focal weakness, no fever, no chills, no productive coughing spells, no hematuria or dysuria and any other complaints.   Workup in the ED demonstrated negative troponin, chest x-ray without acute cardiopulmonary process and a CBC with hemoglobin down to 6.7 (1 month prior to admission blood work demonstrated hemoglobin of 11.6).     TRH has been consulted to place patient in the hospital for further evaluation and management.   Review of Systems: As mentioned in the history of present illness. All other systems reviewed and are negative.   Hospital Course:   Assessment and Plan: 1-acute symptomatic anemia--due to ABLA/GI bleed -Patient admitted with melanotic stools -At baseline using NSAIDs as part of treatment for migraine -Received transfusion of 1  unit of PRBC hemoglobin up to 7.7 from 6.7 -EGD on 05/29/2023 showed Normal esophagus, Gastritis. Biopsied,  Acquired duodenal stenosis. Non-bleeding duodenal ulcer with a clean ulcer base (Forrest Class III) -Avoid NSAIDs -PPI as prescribed -Repeat CBC within a week -Outpatient follow-up with GI as advised   2-class I obesity -Body mass index is 33.73 kg/m. -Low-calorie diet, portion control and increase physical activity discussed with patient.   3-hypokalemia - replaced and normalized.   4-hypertension -Stable, continue amlodipine   Discharge Condition: Stable  Follow UP   Follow-up Information      Marguerita Merles, Reuel Boom, MD. Schedule an appointment as soon as possible for a visit in 1 week(s).   Specialty: Gastroenterology Why: Repeat CBC Contact information: [address] [place] [address] [phone]                  Consults obtained - Gi  Diet and Activity recommendation:  As advised  Discharge Instructions   Discharge Instructions     Call MD for:  difficulty breathing, headache or visual disturbances   Complete by: As directed    Call MD for:  persistant dizziness or light-headedness   Complete by: As directed    Call MD for:  persistant nausea and vomiting   Complete by: As directed    Call MD for:  temperature >100.4   Complete by: As directed    Diet - low sodium heart healthy   Complete by: As directed    Discharge instructions   Complete by: As directed    1)Avoid ibuprofen/Advil/Aleve/Motrin/Goody Powders/Naproxen/BC powders/Meloxicam/Diclofenac/Indomethacin and other Nonsteroidal anti-inflammatory medications as these will make you more likely to bleed and can cause stomach ulcers, can also cause Kidney problems.   2)Please avoid coffee/caffeinated beverages, Tea, chocolate, carbonated drinks/soda, spicy food, Milk, Please Avoid  Alcohol, acidic foods- such as citrus (Lemon, oranges), juices  and tomatoes, Meats with a high fat content. Avoid High-fat condiments and  Fried foods  3)Please Follow up with Gastroenterologist Dr. Levon Hedger-- address [address], [place] [address],,EXBMW Number [phone]  in 1 to 2 weeks for Repeat CBC Blood test   Increase activity slowly   Complete by: As directed          Discharge Medications     Allergies as of 05/30/2023       Reactions   Hydrocodone Nausea And Vomiting        Medication List     STOP taking these medications    BC HEADACHE POWDER PO   fluconazole 150 MG tablet Commonly known as: DIFLUCAN       TAKE these medications     acetaminophen 500 MG tablet Commonly known as: TYLENOL Take 500 mg by mouth every 6 (six) hours as needed for headache.   amLODipine 5 MG tablet Commonly known as: NORVASC Take 1 tablet (5 mg total) by mouth daily. Start taking on: June 02, 2023 What changed: These instructions start on June 02, 2023. If you are unsure what to do until then, ask your doctor or other care provider.   BENADRYL PO Take 1 tablet by mouth daily.   pantoprazole 40 MG tablet Commonly known as: Protonix Take 1 tablet (40 mg total) by mouth 2 (two) times daily. For stomach Ulcer   ROLAIDS PO Take 1 tablet by mouth as needed (heartburn).   sucralfate 1 g tablet Commonly known as: Carafate Take 1 tablet (1 g total) by mouth 4 (four) times daily.  SUMAtriptan 100 MG tablet Commonly known as: IMITREX Take 1 tablet (100 mg total) by mouth once as needed for migraine or headache. May repeat in 2 hours if headache persists or recurs.        Major procedures and Radiology Reports - PLEASE review detailed and final reports for all details, in brief -   DG Chest 2 View  Result Date: 05/29/2023 CLINICAL DATA:  42 year old female with history of shortness of breath and weakness. EXAM: CHEST - 2 VIEW COMPARISON:  No priors. FINDINGS: Lung volumes are normal. No consolidative airspace disease. No pleural effusions. No pneumothorax. No pulmonary nodule or mass noted. Pulmonary vasculature and the cardiomediastinal silhouette are within normal limits. IMPRESSION: No radiographic evidence of acute cardiopulmonary disease. Electronically Signed   By: Trudie Reed M.D.   On: 05/29/2023 07:00    Today   Subjective    Antonella Cormany today has no new complaints         No Nausea, Vomiting or Diarrhea  -Tolerating solid food -No further melanotic BMs   Patient has been seen and examined prior to discharge   Objective   Blood pressure 105/64, pulse 88, temperature 98.3 F (36.8 C), temperature  source Oral, resp. rate 16, height 5\' 6"  (1.676 m), weight 94.8 kg, SpO2 97%.   Intake/Output Summary (Last 24 hours) at 05/30/2023 1327 Last data filed at 05/30/2023 0946 Gross per 24 hour  Intake 483 ml  Output --  Net 483 ml   Exam Gen:- Awake Alert, no acute distress  HEENT:- [place].AT, No sclera icterus Neck-Supple Neck,No JVD,.  Lungs-  CTAB , good air movement bilaterally CV- S1, S2 normal, regular Abd-  +ve B.Sounds, Abd Soft, No tenderness,    Extremity/Skin:- No  edema,   good pulses Psych-affect is appropriate, oriented x3 Neuro-no new focal deficits, no tremors    Data Review   CBC w Diff:  Lab Results  Component Value Date   WBC 8.3 05/30/2023   HGB 7.7 (L) 05/30/2023   HGB 11.6 04/05/2023   HCT 24.9 (L) 05/30/2023   HCT 38.2 04/05/2023   PLT 337 05/30/2023   PLT 345 04/05/2023   LYMPHOPCT 30 11/15/2021   MONOPCT 8 11/15/2021   EOSPCT 1 11/15/2021   BASOPCT 0 11/15/2021   CMP:  Lab Results  Component Value Date   NA 136 05/30/2023   NA 141 04/05/2023   K 3.7 05/30/2023   CL 107 05/30/2023   CO2 24 05/30/2023   BUN 11 05/30/2023   BUN 11 04/05/2023   CREATININE 0.63 05/30/2023   PROT 7.3 04/05/2023   ALBUMIN 4.5 04/05/2023   BILITOT 0.3 04/05/2023   ALKPHOS 95 04/05/2023   AST 23 04/05/2023   ALT 28 04/05/2023   Total Discharge time is about 33 minutes  Shon Hale M.D on 05/30/2023 at 1:27 PM  Go to [email or web address] -  for contact info  [facility] - Office  [phone]

## 2023-05-30 NOTE — Progress Notes (Addendum)
Subjective: Feeling okay this morning. No abdominal pain, nausea or vomiting. Had a BM this morning without melena or BRBPR. Able to tolerate some breakfast without issue.   States she was given imitrex for her headache yesterday. She was inquiring if she would be discharged with this for her headaches. She recently obtained insurance but had been unable to see neurology for headaches prior to admission due to no insurance coverage. She does not currently have a PCP at this time.  Objective: Vital signs in last 24 hours: Temp:  [98 F (36.7 C)-99.5 F (37.5 C)] 98.5 F (36.9 C) (11/13 0427) Pulse Rate:  [85-93] 87 (11/13 0427) Resp:  [13-24] 18 (11/13 0427) BP: (95-131)/(58-97) 95/58 (11/13 0427) SpO2:  [97 %-100 %] 97 % (11/13 0427) Weight:  [94.8 kg] 94.8 kg (11/12 1219) Last BM Date : 05/28/23 General:   Alert and oriented, pleasant Head:  Normocephalic and atraumatic. Eyes:  No icterus, sclera clear. Conjuctiva pink.  Mouth:  Without lesions, mucosa pink and moist.  Heart:  S1, S2 present, no murmurs noted.  Lungs: Clear to auscultation bilaterally, without wheezing, rales, or rhonchi.  Abdomen:  Bowel sounds present, soft, non-tender, non-distended. No HSM or hernias noted. No rebound or guarding. No masses appreciated  Neurologic:  Alert and  oriented x4;  grossly normal neurologically. Skin:  Warm and dry, intact without significant lesions.  Psych:  Alert and cooperative. Normal mood and affect.  Intake/Output from previous day: 11/12 0701 - 11/13 0700 In: 1114 [P.O.:480; I.V.:300; Blood:334] Out: -   Lab Results: Recent Labs    05/29/23 0357 05/29/23 1051 05/30/23 0414  WBC 7.8  --  8.3  HGB 6.7* 7.8* 7.7*  HCT 21.2* 25.9* 24.9*  PLT 306  --  337   BMET Recent Labs    05/29/23 0357 05/30/23 0414  NA 136 136  K 3.3* 3.7  CL 104 107  CO2 25 24  GLUCOSE 119* 101*  BUN 9 11  CREATININE 0.53 0.63  CALCIUM 8.9 8.2*   Assessment: Jaclyn L.  Thornton is a  42 year old female with history of IDA, ectopic pregnancy, migraines who presented to the ED yesterday for exertional shortness of breath and darker stools x 72 hours.  Found to have hemoglobin of 6.7.  Symptomatic anemia with melena: Hemoglobin 6.7 on admission down from 11.6 22-month ago, in setting of daily Goody powder use.  Patient maintained on chronic p.o. iron due to history of IDA.  She underwent EGD yesterday which showed gastritis, acquired duodenal stenosis and nonbleeding duodenal ulcer with a clean ulcer base (Forrest class III).   Patient started on PPI twice daily, advised to avoid all NSAIDs, repeat upper endoscopy in 8 to 12 weeks to evaluate for healing of ulcer.  Hemoglobin 7.7 this morning.  Hemoglobin was 7.8 yesterday status post 1 unit packed red blood cells. Likely due to equilibration/some hemodilution, as she has had no further melena. She is feeling well this morning.   History of chronic headaches, has not seen PCP/neurology in regatrds to this due to lack of insurance until most recently. Discussed with patient that she Needs to establish with PCP on discharge for ongoing management of chronic health issues and further management of her headaches as she needs to avoid all NSAIDs.    Plan: Continue PPI BID Avoid all NSAIDs Repeat EGD 8-12 weeks Monitor for overt GI bleeding Trend h&h, transfuse for hgb <7 Needs to establish with PCP/seen neurology for ongoing management of chronic headaches  GI will sign off today   LOS: 0 days    05/30/2023, 9:00 AM   Charlena Haub L. Jeanmarie Hubert, MSN, APRN, AGNP-C Adult-Gerontology Nurse Practitioner [facility] at [facility]

## 2023-05-31 LAB — SURGICAL PATHOLOGY

## 2023-06-07 ENCOUNTER — Encounter (HOSPITAL_COMMUNITY): Payer: Self-pay | Admitting: Internal Medicine

## 2023-06-07 LAB — CBC WITH DIFFERENTIAL/PLATELET
Basophils Absolute: 0 10*3/uL (ref 0.0–0.2)
Basos: 1 %
EOS (ABSOLUTE): 0.1 10*3/uL (ref 0.0–0.4)
Eos: 1 %
Hematocrit: 27.7 % — ABNORMAL LOW (ref 34.0–46.6)
Hemoglobin: 8.4 g/dL — ABNORMAL LOW (ref 11.1–15.9)
Immature Grans (Abs): 0 10*3/uL (ref 0.0–0.1)
Immature Granulocytes: 0 %
Lymphocytes Absolute: 1.9 10*3/uL (ref 0.7–3.1)
Lymphs: 25 %
MCH: 27 pg (ref 26.6–33.0)
MCHC: 30.3 g/dL — ABNORMAL LOW (ref 31.5–35.7)
MCV: 89 fL (ref 79–97)
Monocytes Absolute: 0.7 10*3/uL (ref 0.1–0.9)
Monocytes: 9 %
Neutrophils Absolute: 4.8 10*3/uL (ref 1.4–7.0)
Neutrophils: 64 %
Platelets: 484 10*3/uL — ABNORMAL HIGH (ref 150–450)
RBC: 3.11 x10E6/uL — ABNORMAL LOW (ref 3.77–5.28)
RDW: 15.9 % — ABNORMAL HIGH (ref 11.7–15.4)
WBC: 7.5 10*3/uL (ref 3.4–10.8)

## 2023-06-16 NOTE — Progress Notes (Unsigned)
Referring Provider: Health, Matthew Folks* Primary Care Physician:  Health, [facility] Primary GI Physician: Dr. Marletta Lor  No chief complaint on file.   HPI:   Jaclyn Thornton is a 42 y.o. female with history of IDA, ectopic pregnancy, migraines, recent hospital admission on 05/29/23 with symptomatic anemia in the setting of melena, hemoglobin down to 6.7 from 11.6, 40-month prior.  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  This was in the setting of frequent Goody powder use.  Recommended PPI twice daily, repeat EGD in 8-12 weeks to evaluate response to therapy and can consider dilation of stricture at that time.  She received 1 unit PRBCs during admission with hemoglobin improved to 7.7 on 11/13 which is when she was discharged from the hospital.  She is presenting today for hospital follow-up.  CBC 06/06/2023 with hemoglobin improved to 8.4.   Today:   Goody powders/NSAIDs:***  Past Medical History:  Diagnosis Date   Anemia    Ectopic pregnancy 2016   Migraines     Past Surgical History:  Procedure Laterality Date   BIOPSY  05/29/2023   Procedure: BIOPSY;  Surgeon: Lanelle Bal, DO;  [facility];  Service: Endoscopy;;   DILATION AND CURETTAGE OF UTERUS     ESOPHAGOGASTRODUODENOSCOPY (EGD) WITH PROPOFOL N/A 05/29/2023   Procedure: ESOPHAGOGASTRODUODENOSCOPY (EGD) WITH PROPOFOL;  Surgeon: Lanelle Bal, DO;  [facility];  Service: Endoscopy;  Laterality: N/A;   LAPAROSCOPIC BILATERAL SALPINGECTOMY Bilateral 07/23/2015   Procedure: LAPAROSCOPIC BILATERAL SALPINGECTOMY;  Surgeon: Tilda Burrow, MD;  Location: [facility];  Service: Gynecology;  Laterality: Bilateral;   TUBAL LIGATION  2001    Current Outpatient Medications  Medication Sig Dispense Refill   acetaminophen (TYLENOL) 500 MG tablet Take 500 mg by mouth every 6 (six) hours as needed for headache.      amLODipine  (NORVASC) 5 MG tablet Take 1 tablet (5 mg total) by mouth daily. 90 tablet 4   Dihydroxyaluminum Sod Carb (ROLAIDS PO) Take 1 tablet by mouth as needed (heartburn).     diphenhydrAMINE HCl (BENADRYL PO) Take 1 tablet by mouth daily.     pantoprazole (PROTONIX) 40 MG tablet Take 1 tablet (40 mg total) by mouth 2 (two) times daily. For stomach Ulcer 60 tablet 3   sucralfate (CARAFATE) 1 g tablet Take 1 tablet (1 g total) by mouth 4 (four) times daily. 120 tablet 0   SUMAtriptan (IMITREX) 100 MG tablet Take 1 tablet (100 mg total) by mouth once as needed for migraine or headache. May repeat in 2 hours if headache persists or recurs. 30 tablet 2   No current facility-administered medications for this visit.    Allergies as of 06/18/2023 - Review Complete 05/29/2023  Allergen Reaction Noted   Hydrocodone Nausea And Vomiting 08/25/2022    Family History  Problem Relation Age of Onset   Diabetes Mother    Hypertension Mother    Diabetes Father    Hypertension Father    Breast cancer Maternal Aunt    Ovarian cancer Maternal Aunt    Breast cancer Paternal Aunt    Breast cancer Paternal Aunt    Dementia Paternal Grandmother    Diabetes Brother     Social History   Socioeconomic History   Marital status: Single    Spouse name: Not on file   Number of children: 2   Years of education: Not on file   Highest education level:  GED or equivalent  Occupational History   Not on file  Tobacco Use   Smoking status: Never   Smokeless tobacco: Never  Vaping Use   Vaping status: Never Used  Substance and Sexual Activity   Alcohol use: Yes    Alcohol/week: 0.0 standard drinks of alcohol    Comment: occ   Drug use: No   Sexual activity: Yes    Birth control/protection: Surgical    Comment: tubal  Other Topics Concern   Not on file  Social History Narrative   Not on file   Social Determinants of Health   Financial Resource Strain: Medium Risk (04/05/2023)   Overall Financial Resource  Strain (CARDIA)    Difficulty of Paying Living Expenses: Somewhat hard  Food Insecurity: No Food Insecurity (05/29/2023)   Hunger Vital Sign    Worried About Running Out of Food in the Last Year: Never true    Ran Out of Food in the Last Year: Never true  Recent Concern: Food Insecurity - Food Insecurity Present (04/05/2023)   Hunger Vital Sign    Worried About Running Out of Food in the Last Year: Sometimes true    Ran Out of Food in the Last Year: Sometimes true  Transportation Needs: No Transportation Needs (05/29/2023)   PRAPARE - Administrator, Civil Service (Medical): No    Lack of Transportation (Non-Medical): No  Physical Activity: Sufficiently Active (04/05/2023)   Exercise Vital Sign    Days of Exercise per Week: 4 days    Minutes of Exercise per Session: 60 min  Stress: No Stress Concern Present (04/05/2023)   [employer] of Occupational Health - Occupational Stress Questionnaire    Feeling of Stress : Only a little  Social Connections: Socially Isolated (04/05/2023)   Social Connection and Isolation Panel [NHANES]    Frequency of Communication with Friends and Family: More than three times a week    Frequency of Social Gatherings with Friends and Family: More than three times a week    Attends Religious Services: Never    Database administrator or Organizations: No    Attends Banker Meetings: Never    Marital Status: Never married    Review of Systems: Gen: Denies fever, chills, anorexia. Denies fatigue, weakness, weight loss.  CV: Denies chest pain, palpitations, syncope, peripheral edema, and claudication. Resp: Denies dyspnea at rest, cough, wheezing, coughing up blood, and pleurisy. GI: Denies vomiting blood, jaundice, and fecal incontinence.   Denies dysphagia or odynophagia. Derm: Denies rash, itching, dry skin Psych: Denies depression, anxiety, memory loss, confusion. No homicidal or suicidal ideation.  Heme: Denies bruising,  bleeding, and enlarged lymph nodes.  Physical Exam: There were no vitals taken for this visit. General:   Alert and oriented. No distress noted. Pleasant and cooperative.  Head:  Normocephalic and atraumatic. Eyes:  Conjuctiva clear without scleral icterus. Heart:  S1, S2 present without murmurs appreciated. Lungs:  Clear to auscultation bilaterally. No wheezes, rales, or rhonchi. No distress.  Abdomen:  +BS, soft, non-tender and non-distended. No rebound or guarding. No HSM or masses noted. Msk:  Symmetrical without gross deformities. Normal posture. Extremities:  Without edema. Neurologic:  Alert and  oriented x4 Psych:  Normal mood and affect.    Assessment:     Plan:  ***   Ermalinda Memos, PA-C [facility] 06/18/2023

## 2023-06-18 ENCOUNTER — Ambulatory Visit (INDEPENDENT_AMBULATORY_CARE_PROVIDER_SITE_OTHER): Payer: Medicaid Other | Admitting: Gastroenterology

## 2023-06-18 ENCOUNTER — Encounter: Payer: Self-pay | Admitting: Gastroenterology

## 2023-06-18 VITALS — BP 120/76 | HR 87 | Temp 97.7°F | Ht 66.0 in | Wt 200.0 lb

## 2023-06-18 DIAGNOSIS — K921 Melena: Secondary | ICD-10-CM | POA: Diagnosis not present

## 2023-06-18 DIAGNOSIS — K315 Obstruction of duodenum: Secondary | ICD-10-CM | POA: Diagnosis not present

## 2023-06-18 DIAGNOSIS — K279 Peptic ulcer, site unspecified, unspecified as acute or chronic, without hemorrhage or perforation: Secondary | ICD-10-CM | POA: Diagnosis not present

## 2023-06-18 DIAGNOSIS — D509 Iron deficiency anemia, unspecified: Secondary | ICD-10-CM

## 2023-06-18 DIAGNOSIS — D500 Iron deficiency anemia secondary to blood loss (chronic): Secondary | ICD-10-CM

## 2023-06-18 NOTE — Patient Instructions (Addendum)
Please have blood work completed at [facility] the latter part of this week or early next week to recheck your hemoglobin.  Continue taking pantoprazole 40 mg twice daily 30 minutes before breakfast and dinner.  Continue taking Carafate 4 times a day.  Continue to avoid all NSAID products.  I will plan to see back in the office in 8-10 weeks to discuss scheduling EGD to verify ulcer healing and to reevaluate the narrowing in your small intestine.  It was good to see you again today!  Ermalinda Memos, PA-C [facility]

## 2023-06-22 LAB — CBC WITH DIFFERENTIAL/PLATELET
Basophils Absolute: 0 10*3/uL (ref 0.0–0.2)
Basos: 0 %
EOS (ABSOLUTE): 0.1 10*3/uL (ref 0.0–0.4)
Eos: 1 %
Hematocrit: 30.3 % — ABNORMAL LOW (ref 34.0–46.6)
Hemoglobin: 9 g/dL — ABNORMAL LOW (ref 11.1–15.9)
Immature Grans (Abs): 0 10*3/uL (ref 0.0–0.1)
Immature Granulocytes: 0 %
Lymphocytes Absolute: 1.7 10*3/uL (ref 0.7–3.1)
Lymphs: 23 %
MCH: 25.4 pg — ABNORMAL LOW (ref 26.6–33.0)
MCHC: 29.7 g/dL — ABNORMAL LOW (ref 31.5–35.7)
MCV: 86 fL (ref 79–97)
Monocytes Absolute: 0.6 10*3/uL (ref 0.1–0.9)
Monocytes: 8 %
Neutrophils Absolute: 4.9 10*3/uL (ref 1.4–7.0)
Neutrophils: 68 %
Platelets: 358 10*3/uL (ref 150–450)
RBC: 3.54 x10E6/uL — ABNORMAL LOW (ref 3.77–5.28)
RDW: 15.2 % (ref 11.7–15.4)
WBC: 7.2 10*3/uL (ref 3.4–10.8)

## 2023-07-13 ENCOUNTER — Telehealth: Payer: Self-pay | Admitting: *Deleted

## 2023-07-13 ENCOUNTER — Other Ambulatory Visit: Payer: Self-pay | Admitting: Gastroenterology

## 2023-07-13 DIAGNOSIS — K279 Peptic ulcer, site unspecified, unspecified as acute or chronic, without hemorrhage or perforation: Secondary | ICD-10-CM

## 2023-07-13 MED ORDER — SUCRALFATE 1 G PO TABS: 1.0000 g | ORAL_TABLET | Freq: Four times a day (QID) | ORAL | 0 refills | Status: DC

## 2023-07-13 NOTE — Telephone Encounter (Signed)
Pt called and needs a refill on Carafate sent to [facility]. Pt last OV 06/18/2023

## 2023-07-13 NOTE — Telephone Encounter (Signed)
Rx sent 

## 2023-08-07 ENCOUNTER — Other Ambulatory Visit: Payer: Self-pay | Admitting: Gastroenterology

## 2023-08-07 DIAGNOSIS — K279 Peptic ulcer, site unspecified, unspecified as acute or chronic, without hemorrhage or perforation: Secondary | ICD-10-CM

## 2023-08-19 NOTE — Progress Notes (Unsigned)
Referring Provider: Health, Matthew Folks* Primary Care Physician:  Health, [facility] Primary GI Physician: Dr. Marletta Lor  Chief Complaint  Patient presents with   Follow-up    Follow up. No problems     HPI:   Jaclyn Thornton is a 43 y.o. female with history of IDA, ectopic pregnancy, migraines, acute anemia in the setting of duodenal ulcer secondary to [facility] powders, presenting today to discuss scheduling surveillance EGD.   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  Recommended repeat EGD in 8-12 weeks to evaluate response to therapy and can consider dilation of stricture at that time.   At her last visit 06/18/23, she had discontinued NSAIDs and was taking PPI BID and carafate QID. Denies overt GI bleeding or other significant GI symptoms.   CBC updated 12/5 with Hgb improved to 9.0.   Today:  Reports he is doing well overall.  No BRBPR, melena, abdominal pain, nausea, vomiting, reflux symptoms.  Reports chronic fluctuation in her bowel habits. May have 3-4 BMs per day that are looser. This is about once a week. The following day, she will skip and not have a BM, then will return to normal bowel movements.  This started 1-2 years ago. Will make her late for work at times.  No associated abdominal pain.  Milk will cause loose stools. Doesn't drink milk routinely. Eats yogurt every other day and seems to tolerate this ok.   Still taking iron daily.   No NSAIDs.    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  Repeat EGD in 8-12 weeks to evaluate response to therapy and consider dilation of stricture.   LAPAROSCOPIC BILATERAL SALPINGECTOMY Bilateral 07/23/2015   Procedure: LAPAROSCOPIC BILATERAL SALPINGECTOMY;  Surgeon: Tilda Burrow, MD;  Location: [facility];  Service: Gynecology;  Laterality: Bilateral;   TUBAL LIGATION  2001    Current Outpatient Medications  Medication Sig Dispense Refill   acetaminophen (TYLENOL) 500 MG tablet Take 500 mg by mouth every 6 (six) hours as needed for headache.      amLODipine (NORVASC) 5 MG tablet Take 1 tablet (5 mg total) by mouth daily. 90 tablet 4   Dihydroxyaluminum Sod Carb (ROLAIDS PO) Take 1 tablet by mouth as needed (heartburn).     diphenhydrAMINE HCl (BENADRYL PO) Take 1 tablet by mouth daily.     ferrous sulfate 325 (65 FE) MG EC tablet Take 325 mg by mouth daily with breakfast.     pantoprazole (PROTONIX) 40 MG tablet Take 1 tablet (40 mg total) by mouth 2 (two) times daily. For stomach Ulcer 60 tablet 3   sucralfate (CARAFATE) 1 g tablet TAKE 1 TABLET(1 GRAM) BY MOUTH FOUR TIMES DAILY 120 tablet 0   SUMAtriptan (IMITREX) 100 MG tablet Take 1 tablet (100 mg total) by mouth once as needed for migraine or headache. May  repeat in 2 hours if headache persists or recurs. 30 tablet 2   No current facility-administered medications for this visit.    Allergies as of 08/20/2023 - Review Complete 08/20/2023  Allergen Reaction Noted   Hydrocodone Nausea And Vomiting 08/25/2022    Family History  Problem Relation Age of Onset   Diabetes Mother    Hypertension Mother    Diabetes Father    Hypertension Father    Diabetes Brother    Dementia Paternal Grandmother    Breast cancer Maternal Aunt    Ovarian cancer Maternal Aunt    Breast cancer Paternal Aunt     Breast cancer Paternal Aunt    Colon cancer Neg Hx     Social History   Socioeconomic History   Marital status: Single    Spouse name: Not on file   Number of children: 2   Years of education: Not on file   Highest education level: GED or equivalent  Occupational History   Not on file  Tobacco Use   Smoking status: Never   Smokeless tobacco: Never  Vaping Use   Vaping status: Never Used  Substance and Sexual Activity   Alcohol use: Yes    Alcohol/week: 0.0 standard drinks of alcohol    Comment: occ   Drug use: No   Sexual activity: Yes    Birth control/protection: Surgical    Comment: tubal  Other Topics Concern   Not on file  Social History Narrative   Not on file   Social Drivers of Health   Financial Resource Strain: Medium Risk (04/05/2023)   Overall Financial Resource Strain (CARDIA)    Difficulty of Paying Living Expenses: Somewhat hard  Food Insecurity: No Food Insecurity (05/29/2023)   Hunger Vital Sign    Worried About Running Out of Food in the Last Year: Never true    Ran Out of Food in the Last Year: Never true  Recent Concern: Food Insecurity - Food Insecurity Present (04/05/2023)   Hunger Vital Sign    Worried About Running Out of Food in the Last Year: Sometimes true    Ran Out of Food in the Last Year: Sometimes true  Transportation Needs: No Transportation Needs (05/29/2023)   PRAPARE - Administrator, Civil Service (Medical): No    Lack of Transportation (Non-Medical): No  Physical Activity: Sufficiently Active (04/05/2023)   Exercise Vital Sign    Days of Exercise per Week: 4 days    Minutes of Exercise per Session: 60 min  Stress: No Stress Concern Present (04/05/2023)   [employer] of Occupational Health - Occupational Stress Questionnaire    Feeling of Stress : Only a little  Social Connections: Socially Isolated (04/05/2023)   Social Connection and Isolation Panel [NHANES]    Frequency of Communication with Friends and  Family: More than three times a week    Frequency of Social Gatherings with Friends and Family: More than three times a week    Attends Religious Services: Never    Database administrator or Organizations: No    Attends Engineer, structural: Never    Marital Status: Never married    Review of Systems: Gen: Denies fever, chills, cold or flulike symptoms, presyncope, syncope. CV: Denies chest pain, palpitations. Resp: Denies dyspnea, cough. GI: See HPI Heme: See HPI  Physical Exam: BP 125/85 (BP Location: Right Arm, Patient Position: Sitting, Cuff Size: Large)   Pulse 71   Temp 97.7 F (36.5 C) (Temporal)  Ht 5\' 6"  (1.676 m)   Wt 203 lb (92.1 kg)   LMP 08/16/2023   BMI 32.77 kg/m  General:   Alert and oriented. No distress noted. Pleasant and cooperative.  Head:  Normocephalic and atraumatic. Eyes:  Conjuctiva clear without scleral icterus. Heart:  S1, S2 present without murmurs appreciated. Lungs:  Clear to auscultation bilaterally. No wheezes, rales, or rhonchi. No distress.  Abdomen:  +BS, soft, non-tender and non-distended. No rebound or guarding. No HSM or masses noted. Msk:  Symmetrical without gross deformities. Normal posture. Extremities:  Without edema. Neurologic:  Alert and  oriented x4 Psych:  Normal mood and affect.    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  Duodenal ulcer/duodenal stricture/IDA: Previously with acute symptomatic anemia in November 2024 with hemoglobin down to 6.7.  EGD 11/12 showing gastritis, nonbleeding duodenal ulcer with acquired duodenal stenosis.  This was in the setting of Goody powders which she has discontinued.  She has now been on PPI twice daily and Carafate 4 times daily since her EGD.  Clinically, she is feeling well.  Denies overt GI bleeding.  Most recent  hemoglobin on file had improved to 9.0 on 06/21/2023.  Will plan to update this along with an EGD to verify ulcer healing.  Intermittent loose stool: May be secondary to lactose intolerance/other unidentified dietary intolerance.  No abdominal pain to suggest IBS or IBD.  No rectal bleeding or other alarm symptoms.  As she will have an EGD in the near future, recommend duodenal biopsies to rule out celiac disease.  Otherwise, recommended Lactaid tablets prior to dairy consumption, daily fiber, and probiotic along with keeping an event log to see if she can identify dietary triggers.   Plan:  CBC, iron panel Proceed with upper endoscopy with duodenal biopsies and possible dilation of duodenal stricture with propofol by Dr. Marletta Lor in near future. The risks, benefits, and alternatives have been discussed with the patient in detail. The patient states understanding and desires to proceed.  ASA 3 Continue pantoprazole 40 mg twice daily and Carafate 4 times daily until EGD. Continue daily iron.  3 Lactaid tablets prior to dairy consumption. Benefiber 3 teaspoons daily. May try adding daily probiotic. Keep event log of when loose stool occurs, what was eaten prior. Follow-up in 3 months or sooner if needed.   Ermalinda Memos, PA-C [facility] 08/20/2023

## 2023-08-19 NOTE — H&P (View-Only) (Signed)
Referring Provider: Health, Matthew Folks* Primary Care Physician:  Health, [facility] Primary GI Physician: Dr. Marletta Lor  Chief Complaint  Patient presents with   Follow-up    Follow up. No problems     HPI:   Jaclyn Thornton is a 43 y.o. female with history of IDA, ectopic pregnancy, migraines, acute anemia in the setting of duodenal ulcer secondary to [facility] powders, presenting today to discuss scheduling surveillance EGD.   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  Recommended repeat EGD in 8-12 weeks to evaluate response to therapy and can consider dilation of stricture at that time.   At her last visit 06/18/23, she had discontinued NSAIDs and was taking PPI BID and carafate QID. Denies overt GI bleeding or other significant GI symptoms.   CBC updated 12/5 with Hgb improved to 9.0.   Today:  Reports he is doing well overall.  No BRBPR, melena, abdominal pain, nausea, vomiting, reflux symptoms.  Reports chronic fluctuation in her bowel habits. May have 3-4 BMs per day that are looser. This is about once a week. The following day, she will skip and not have a BM, then will return to normal bowel movements.  This started 1-2 years ago. Will make her late for work at times.  No associated abdominal pain.  Milk will cause loose stools. Doesn't drink milk routinely. Eats yogurt every other day and seems to tolerate this ok.   Still taking iron daily.   No NSAIDs.    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  Repeat EGD in 8-12 weeks to evaluate response to therapy and consider dilation of stricture.   LAPAROSCOPIC BILATERAL SALPINGECTOMY Bilateral 07/23/2015   Procedure: LAPAROSCOPIC BILATERAL SALPINGECTOMY;  Surgeon: Tilda Burrow, MD;  Location: [facility] ORS;  Service: Gynecology;  Laterality: Bilateral;   TUBAL LIGATION  2001    Current Outpatient Medications  Medication Sig Dispense Refill   acetaminophen (TYLENOL) 500 MG tablet Take 500 mg by mouth every 6 (six) hours as needed for headache.      amLODipine (NORVASC) 5 MG tablet Take 1 tablet (5 mg total) by mouth daily. 90 tablet 4   Dihydroxyaluminum Sod Carb (ROLAIDS PO) Take 1 tablet by mouth as needed (heartburn).     diphenhydrAMINE HCl (BENADRYL PO) Take 1 tablet by mouth daily.     ferrous sulfate 325 (65 FE) MG EC tablet Take 325 mg by mouth daily with breakfast.     pantoprazole (PROTONIX) 40 MG tablet Take 1 tablet (40 mg total) by mouth 2 (two) times daily. For stomach Ulcer 60 tablet 3   sucralfate (CARAFATE) 1 g tablet TAKE 1 TABLET(1 GRAM) BY MOUTH FOUR TIMES DAILY 120 tablet 0   SUMAtriptan (IMITREX) 100 MG tablet Take 1 tablet (100 mg total) by mouth once as needed for migraine or headache. May  repeat in 2 hours if headache persists or recurs. 30 tablet 2   No current facility-administered medications for this visit.    Allergies as of 08/20/2023 - Review Complete 08/20/2023  Allergen Reaction Noted   Hydrocodone Nausea And Vomiting 08/25/2022    Family History  Problem Relation Age of Onset   Diabetes Mother    Hypertension Mother    Diabetes Father    Hypertension Father    Diabetes Brother    Dementia Paternal Grandmother    Breast cancer Maternal Aunt    Ovarian cancer Maternal Aunt    Breast cancer Paternal Aunt     Breast cancer Paternal Aunt    Colon cancer Neg Hx     Social History   Socioeconomic History   Marital status: Single    Spouse name: Not on file   Number of children: 2   Years of education: Not on file   Highest education level: GED or equivalent  Occupational History   Not on file  Tobacco Use   Smoking status: Never   Smokeless tobacco: Never  Vaping Use   Vaping status: Never Used  Substance and Sexual Activity   Alcohol use: Yes    Alcohol/week: 0.0 standard drinks of alcohol    Comment: occ   Drug use: No   Sexual activity: Yes    Birth control/protection: Surgical    Comment: tubal  Other Topics Concern   Not on file  Social History Narrative   Not on file   Social Drivers of Health   Financial Resource Strain: Medium Risk (04/05/2023)   Overall Financial Resource Strain (CARDIA)    Difficulty of Paying Living Expenses: Somewhat hard  Food Insecurity: No Food Insecurity (05/29/2023)   Hunger Vital Sign    Worried About Running Out of Food in the Last Year: Never true    Ran Out of Food in the Last Year: Never true  Recent Concern: Food Insecurity - Food Insecurity Present (04/05/2023)   Hunger Vital Sign    Worried About Running Out of Food in the Last Year: Sometimes true    Ran Out of Food in the Last Year: Sometimes true  Transportation Needs: No Transportation Needs (05/29/2023)   PRAPARE - Administrator, Civil Service (Medical): No    Lack of Transportation (Non-Medical): No  Physical Activity: Sufficiently Active (04/05/2023)   Exercise Vital Sign    Days of Exercise per Week: 4 days    Minutes of Exercise per Session: 60 min  Stress: No Stress Concern Present (04/05/2023)   [employer] of Occupational Health - Occupational Stress Questionnaire    Feeling of Stress : Only a little  Social Connections: Socially Isolated (04/05/2023)   Social Connection and Isolation Panel [NHANES]    Frequency of Communication with Friends and  Family: More than three times a week    Frequency of Social Gatherings with Friends and Family: More than three times a week    Attends Religious Services: Never    Database administrator or Organizations: No    Attends Engineer, structural: Never    Marital Status: Never married    Review of Systems: Gen: Denies fever, chills, cold or flulike symptoms, presyncope, syncope. CV: Denies chest pain, palpitations. Resp: Denies dyspnea, cough. GI: See HPI Heme: See HPI  Physical Exam: BP 125/85 (BP Location: Right Arm, Patient Position: Sitting, Cuff Size: Large)   Pulse 71   Temp 97.7 F (36.5 C) (Temporal)  Ht 5\' 6"  (1.676 m)   Wt 203 lb (92.1 kg)   LMP 08/16/2023   BMI 32.77 kg/m  General:   Alert and oriented. No distress noted. Pleasant and cooperative.  Head:  Normocephalic and atraumatic. Eyes:  Conjuctiva clear without scleral icterus. Heart:  S1, S2 present without murmurs appreciated. Lungs:  Clear to auscultation bilaterally. No wheezes, rales, or rhonchi. No distress.  Abdomen:  +BS, soft, non-tender and non-distended. No rebound or guarding. No HSM or masses noted. Msk:  Symmetrical without gross deformities. Normal posture. Extremities:  Without edema. Neurologic:  Alert and  oriented x4 Psych:  Normal mood and affect.    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  Duodenal ulcer/duodenal stricture/IDA: Previously with acute symptomatic anemia in November 2024 with hemoglobin down to 6.7.  EGD 11/12 showing gastritis, nonbleeding duodenal ulcer with acquired duodenal stenosis.  This was in the setting of Goody powders which she has discontinued.  She has now been on PPI twice daily and Carafate 4 times daily since her EGD.  Clinically, she is feeling well.  Denies overt GI bleeding.  Most recent  hemoglobin on file had improved to 9.0 on 06/21/2023.  Will plan to update this along with an EGD to verify ulcer healing.  Intermittent loose stool: May be secondary to lactose intolerance/other unidentified dietary intolerance.  No abdominal pain to suggest IBS or IBD.  No rectal bleeding or other alarm symptoms.  As she will have an EGD in the near future, recommend duodenal biopsies to rule out celiac disease.  Otherwise, recommended Lactaid tablets prior to dairy consumption, daily fiber, and probiotic along with keeping an event log to see if she can identify dietary triggers.   Plan:  CBC, iron panel Proceed with upper endoscopy with duodenal biopsies and possible dilation of duodenal stricture with propofol by Dr. Marletta Lor in near future. The risks, benefits, and alternatives have been discussed with the patient in detail. The patient states understanding and desires to proceed.  ASA 3 Continue pantoprazole 40 mg twice daily and Carafate 4 times daily until EGD. Continue daily iron.  3 Lactaid tablets prior to dairy consumption. Benefiber 3 teaspoons daily. May try adding daily probiotic. Keep event log of when loose stool occurs, what was eaten prior. Follow-up in 3 months or sooner if needed.   Ermalinda Memos, PA-C [facility] 08/20/2023

## 2023-08-20 ENCOUNTER — Encounter: Payer: Self-pay | Admitting: *Deleted

## 2023-08-20 ENCOUNTER — Encounter: Payer: Self-pay | Admitting: Gastroenterology

## 2023-08-20 ENCOUNTER — Telehealth: Payer: Self-pay | Admitting: *Deleted

## 2023-08-20 ENCOUNTER — Ambulatory Visit (INDEPENDENT_AMBULATORY_CARE_PROVIDER_SITE_OTHER): Payer: Medicaid Other | Admitting: Gastroenterology

## 2023-08-20 VITALS — BP 125/85 | HR 71 | Temp 97.7°F | Ht 66.0 in | Wt 203.0 lb

## 2023-08-20 DIAGNOSIS — D509 Iron deficiency anemia, unspecified: Secondary | ICD-10-CM | POA: Diagnosis not present

## 2023-08-20 DIAGNOSIS — K315 Obstruction of duodenum: Secondary | ICD-10-CM

## 2023-08-20 DIAGNOSIS — K269 Duodenal ulcer, unspecified as acute or chronic, without hemorrhage or perforation: Secondary | ICD-10-CM | POA: Diagnosis not present

## 2023-08-20 DIAGNOSIS — R195 Other fecal abnormalities: Secondary | ICD-10-CM | POA: Diagnosis not present

## 2023-08-20 DIAGNOSIS — K279 Peptic ulcer, site unspecified, unspecified as acute or chronic, without hemorrhage or perforation: Secondary | ICD-10-CM

## 2023-08-20 NOTE — Patient Instructions (Addendum)
We will get you scheduled for an upper endoscopy with possible dilation of the stricture in your small bowel.  I am also requesting that biopsies are taken from your small bowel to rule out celiac disease.  Please have blood work completed at [facility] to recheck your hemoglobin.  Continue pantoprazole 40 mg twice a day and Carafate 4 times a day until your upper endoscopy has been completed.  Dr. Marletta Lor will let you know at that time if we can make changes to your medications.  Continue iron daily for now.  Continue to avoid all NSAIDs.  For your intermittent loose stool: Take 3 Lactaid tablets prior to dairy consumption. May try adding a daily probiotic such as align, digestive advantage, Vear Clock' colon health. I also recommend that you start Benefiber 3 teaspoons daily. Keep an event log of when your loose stools occur, what you have eaten prior to the occurrence to see if we can identify any specific dietary triggers.  I will plan to see back in 3 months or sooner if needed.  Ermalinda Memos, PA-C [facility]

## 2023-08-20 NOTE — Telephone Encounter (Signed)
[employer] PA:  Status: PENDED  Reason: 1.Disposition pending review Tracking #: [id number]

## 2023-08-21 LAB — CBC WITH DIFFERENTIAL/PLATELET
Basophils Absolute: 0 10*3/uL (ref 0.0–0.2)
Basos: 0 %
EOS (ABSOLUTE): 0.1 10*3/uL (ref 0.0–0.4)
Eos: 1 %
Hematocrit: 36.8 % (ref 34.0–46.6)
Hemoglobin: 11 g/dL — ABNORMAL LOW (ref 11.1–15.9)
Immature Grans (Abs): 0 10*3/uL (ref 0.0–0.1)
Immature Granulocytes: 0 %
Lymphocytes Absolute: 1.7 10*3/uL (ref 0.7–3.1)
Lymphs: 23 %
MCH: 24.9 pg — ABNORMAL LOW (ref 26.6–33.0)
MCHC: 29.9 g/dL — ABNORMAL LOW (ref 31.5–35.7)
MCV: 83 fL (ref 79–97)
Monocytes Absolute: 0.6 10*3/uL (ref 0.1–0.9)
Monocytes: 8 %
Neutrophils Absolute: 4.7 10*3/uL (ref 1.4–7.0)
Neutrophils: 68 %
Platelets: 367 10*3/uL (ref 150–450)
RBC: 4.42 x10E6/uL (ref 3.77–5.28)
RDW: 16.3 % — ABNORMAL HIGH (ref 11.7–15.4)
WBC: 7.1 10*3/uL (ref 3.4–10.8)

## 2023-08-21 LAB — IRON,TIBC AND FERRITIN PANEL
Ferritin: 16 ng/mL (ref 15–150)
Iron Saturation: 8 % — CL (ref 15–55)
Iron: 35 ug/dL (ref 27–159)
Total Iron Binding Capacity: 423 ug/dL (ref 250–450)
UIBC: 388 ug/dL (ref 131–425)

## 2023-08-21 NOTE — Telephone Encounter (Signed)
[employer] PA: Approval # [id number] Start date 09/03/2023 End date 12/02/2023

## 2023-08-29 NOTE — Patient Instructions (Signed)
Jaclyn Thornton  08/29/2023     @PREFPERIOPPHARMACY @   Your procedure is scheduled on  09/03/2023.   Report to [facility] at  1225  P.M.   Call this number if you have problems the morning of surgery:  [phone]  If you experience any cold or flu symptoms such as cough, fever, chills, shortness of breath, etc. between now and your scheduled surgery, please notify us at the above number.   Remember:  Follow the diet instructions given to you by the office.   You may drink clear liquids until  1025 am on 09/03/2023.    Clear liquids allowed are:                    Water, Juice (No red color; non-citric and without pulp; diabetics please choose diet or no sugar options), Carbonated beverages (diabetics please choose diet or no sugar options), Clear Tea (No creamer, milk, or cream, including half & half and powdered creamer), Black Coffee Only (No creamer, milk or cream, including half & half and powdered creamer), and Clear Sports drink (No red color; diabetics please choose diet or no sugar options)    Take these medicines the morning of surgery with A SIP OF WATER       amlodipine, pantoprazole, sumatriptin (if needed).     Do not wear jewelry, make-up or nail polish, including gel polish,  artificial nails, or any other type of covering on natural nails (fingers and  toes).  Do not wear lotions, powders, or perfumes, or deodorant.  Do not shave 48 hours prior to surgery.  Men may shave face and neck.  Do not bring valuables to the hospital.  [facility] is not responsible for any belongings or valuables.  Contacts, dentures or bridgework may not be worn into surgery.  Leave your suitcase in the car.  After surgery it may be brought to your room.  For patients admitted to the hospital, discharge time will be determined by your treatment team.  Patients discharged the day of surgery will not be allowed to drive home and must have someone with them for 24 hours.     Special instructions:   DO NOT smoke tobacco or vape for 24 hours before your procedure.  Please read over the following fact sheets that you were given. Anesthesia Post-op Instructions and Care and Recovery After Surgery      Upper Endoscopy, Adult, Care After After the procedure, it is common to have a sore throat. It is also common to have: Mild stomach pain or discomfort. Bloating. Nausea. Follow these instructions at home: The instructions below may help you care for yourself at home. Your health care provider may give you more instructions. If you have questions, ask your health care provider. If you were given a sedative during the procedure, it can affect you for several hours. Do not drive or operate machinery until your health care provider says that it is safe. If you will be going home right after the procedure, plan to have a responsible adult: Take you home from the hospital or clinic. You will not be allowed to drive. Care for you for the time you are told. Follow instructions from your health care provider about what you may eat and drink. Return to your normal activities as told by your health care provider. Ask your health care provider what activities are safe for you. Take over-the-counter and prescription medicines  only as told by your health care provider. Contact a health care provider if you: Have a sore throat that lasts longer than one day. Have trouble swallowing. Have a fever. Get help right away if you: Vomit blood or your vomit looks like coffee grounds. Have bloody, black, or tarry stools. Have a very bad sore throat or you cannot swallow. Have difficulty breathing or very bad pain in your chest or abdomen. These symptoms may be an emergency. Get help right away. Call 911. Do not wait to see if the symptoms will go away. Do not drive yourself to the hospital. Summary After the procedure, it is common to have a sore throat, mild stomach  discomfort, bloating, and nausea. If you were given a sedative during the procedure, it can affect you for several hours. Do not drive until your health care provider says that it is safe. Follow instructions from your health care provider about what you may eat and drink. Return to your normal activities as told by your health care provider. This information is not intended to replace advice given to you by your health care provider. Make sure you discuss any questions you have with your health care provider. Document Revised: 10/12/2021 Document Reviewed: 10/12/2021 Elsevier Patient Education  2024 Elsevier Inc.Esophageal Dilatation Esophageal dilatation, or dilation, is done to stretch a blocked or narrowed part of your esophagus. The esophagus is the part of your body that moves food from your mouth to your stomach. You may need to have it stretched if: You have a lot of scar tissue and it makes it hard or painful to swallow. You have cancer of the esophagus. There's a problem with how food moves through your esophagus. In some cases, you may need to have this procedure done more than once. Tell a health care provider about: Any allergies you have. All medicines you're taking, including vitamins, herbs, eye drops, creams, and over-the-counter medicines. Any problems you or family members have had with anesthesia. Any bleeding problems you have. Any surgeries you've had. Any medical conditions you have. Whether you're pregnant or may be pregnant. What are the risks? Your health care provider will talk with you about risks. These may include: Bleeding. A hole or tear in your esophagus. What happens before the procedure? When to stop eating and drinking Follow instructions from your provider about what you may eat and drink. These may include: 8 hours before your procedure Stop eating most foods. Do not eat meat, fried foods, or fatty foods. Eat only light foods, such as toast or  crackers. All liquids are okay except energy drinks and alcohol. 6 hours before your procedure Stop eating. Drink only clear liquids, such as water, clear fruit juice, black coffee, plain tea, and sports drinks. Do not drink energy drinks or alcohol. 2 hours before your procedure Stop drinking all liquids. You may be allowed to take medicines with small sips of water. If you don't follow your provider's instructions, your procedure may be delayed or canceled. Medicines Ask your provider about: Changing or stopping your regular medicines. These include any diabetes medicines or blood thinners you take. Taking medicines such as aspirin and ibuprofen. These medicines can thin your blood. Do not take them unless your provider tells you to. Taking over-the-counter medicines, vitamins, herbs, and supplements. General instructions If you'll be going home right after the procedure, plan to have a responsible adult: Take you home from the hospital or clinic. You won't be allowed to drive. Care for  you for the time you're told. What happens during the procedure? You may be given: A sedative. This helps you relax. Anesthesia. This keeps you from feeling pain. It will numb certain areas of your body. The stretching may be done with: Simple dilators. These are tools put in your esophagus to stretch it. Guide wires. These wires are put in using a tube called an endoscope. A dilator is put over the wires to stretch your esophagus. Then the wires are taken out. A balloon. The balloon is on the end of a tube. It's inflated to stretch your esophagus. The procedure may vary among providers and hospitals. What can I expect after the procedure? Your blood pressure, heart rate, breathing rate, and blood oxygen level will be monitored until you leave the hospital or clinic. Your throat may feel sore and numb. This will get better over time. You won't be allowed to eat or drink until your throat is no longer  numb. You may be able to go home when you can: Drink. Pee. Sit on the edge of the bed without nausea or dizziness. Follow these instructions at home: Activity If you were given a sedative during the procedure, it can affect you for several hours. Do not drive or operate machinery until your provider says it's safe. Return to your normal activities as told by your provider. Ask your provider what activities are safe for you. General instructions Take over-the-counter and prescription medicines only as told by your provider. Follow instructions from your provider about what you may eat and drink. Do not use any products that contain nicotine or tobacco. These products include cigarettes, chewing tobacco, and vaping devices, such as e-cigarettes. If you need help quitting, ask your provider. Keep all follow-up visits. Your provider will make sure the procedure worked. Where to find more information American Society for Gastrointestinal Endoscopy (ASGE): asge.org Contact a health care provider if: You have trouble swallowing. You have a fever. Your pain doesn't get better with medicine. Get help right away if: You have chest pain. You have trouble breathing. You vomit blood. Your poop is: Black. Tarry. Bloody. These symptoms may be an emergency. Get help right away. Call 911. Do not wait to see if the symptoms will go away. Do not drive yourself to the hospital. This information is not intended to replace advice given to you by your health care provider. Make sure you discuss any questions you have with your health care provider. Document Revised: 09/29/2022 Document Reviewed: 09/29/2022 Elsevier Patient Education  2024 Elsevier Inc.Monitored Anesthesia Care, Care After The following information offers guidance on how to care for yourself after your procedure. Your health care provider may also give you more specific instructions. If you have problems or questions, contact your health  care provider. What can I expect after the procedure? After the procedure, it is common to have: Tiredness. Little or no memory about what happened during or after the procedure. Impaired judgment when it comes to making decisions. Nausea or vomiting. Some trouble with balance. Follow these instructions at home: For the time period you were told by your health care provider:  Rest. Do not participate in activities where you could fall or become injured. Do not drive or use machinery. Do not drink alcohol. Do not take sleeping pills or medicines that cause drowsiness. Do not make important decisions or sign legal documents. Do not take care of children on your own. Medicines Take over-the-counter and prescription medicines only as told by your  health care provider. If you were prescribed antibiotics, take them as told by your health care provider. Do not stop using the antibiotic even if you start to feel better. Eating and drinking Follow instructions from your health care provider about what you may eat and drink. Drink enough fluid to keep your urine pale yellow. If you vomit: Drink clear fluids slowly and in small amounts as you are able. Clear fluids include water, ice chips, low-calorie sports drinks, and fruit juice that has water added to it (diluted fruit juice). Eat light and bland foods in small amounts as you are able. These foods include bananas, applesauce, rice, lean meats, toast, and crackers. General instructions  Have a responsible adult stay with you for the time you are told. It is important to have someone help care for you until you are awake and alert. If you have sleep apnea, surgery and some medicines can increase your risk for breathing problems. Follow instructions from your health care provider about wearing your sleep device: When you are sleeping. This includes during daytime naps. While taking prescription pain medicines, sleeping medicines, or medicines  that make you drowsy. Do not use any products that contain nicotine or tobacco. These products include cigarettes, chewing tobacco, and vaping devices, such as e-cigarettes. If you need help quitting, ask your health care provider. Contact a health care provider if: You feel nauseous or vomit every time you eat or drink. You feel light-headed. You are still sleepy or having trouble with balance after 24 hours. You get a rash. You have a fever. You have redness or swelling around the IV site. Get help right away if: You have trouble breathing. You have new confusion after you get home. These symptoms may be an emergency. Get help right away. Call 911. Do not wait to see if the symptoms will go away. Do not drive yourself to the hospital. This information is not intended to replace advice given to you by your health care provider. Make sure you discuss any questions you have with your health care provider. Document Revised: 11/28/2021 Document Reviewed: 11/28/2021 Elsevier Patient Education  2024 [employer].

## 2023-08-30 ENCOUNTER — Other Ambulatory Visit: Payer: Self-pay

## 2023-08-30 ENCOUNTER — Encounter (HOSPITAL_COMMUNITY)
Admission: RE | Admit: 2023-08-30 | Discharge: 2023-08-30 | Disposition: A | Discharge status: Home / Self Care | Payer: Medicaid Other | Source: Ambulatory Visit | Attending: Internal Medicine | Admitting: Internal Medicine

## 2023-08-30 ENCOUNTER — Encounter (HOSPITAL_COMMUNITY): Payer: Self-pay

## 2023-08-30 VITALS — Ht 66.0 in | Wt 203.0 lb

## 2023-08-30 DIAGNOSIS — Z01818 Encounter for other preprocedural examination: Secondary | ICD-10-CM

## 2023-08-30 DIAGNOSIS — Z01812 Encounter for preprocedural laboratory examination: Secondary | ICD-10-CM | POA: Insufficient documentation

## 2023-08-30 HISTORY — DX: Gastro-esophageal reflux disease without esophagitis: K21.9

## 2023-08-30 LAB — POCT PREGNANCY, URINE: Preg Test, Ur: NEGATIVE

## 2023-08-31 ENCOUNTER — Encounter (HOSPITAL_COMMUNITY): Payer: Self-pay | Admitting: Internal Medicine

## 2023-09-03 ENCOUNTER — Encounter (HOSPITAL_COMMUNITY): Payer: Self-pay | Admitting: Internal Medicine

## 2023-09-03 ENCOUNTER — Ambulatory Visit (HOSPITAL_COMMUNITY): Payer: Medicaid Other | Admitting: Anesthesiology

## 2023-09-03 ENCOUNTER — Ambulatory Visit (HOSPITAL_COMMUNITY)
Admission: RE | Admit: 2023-09-03 | Discharge: 2023-09-03 | Disposition: A | Discharge status: Home / Self Care | Payer: Medicaid Other | Attending: Internal Medicine | Admitting: Internal Medicine

## 2023-09-03 ENCOUNTER — Encounter (HOSPITAL_COMMUNITY)
Admission: RE | Disposition: A | Discharge status: Home / Self Care | Payer: Self-pay | Source: Home / Self Care | Attending: Internal Medicine

## 2023-09-03 DIAGNOSIS — K315 Obstruction of duodenum: Secondary | ICD-10-CM | POA: Diagnosis not present

## 2023-09-03 DIAGNOSIS — K2970 Gastritis, unspecified, without bleeding: Secondary | ICD-10-CM | POA: Diagnosis not present

## 2023-09-03 DIAGNOSIS — K2980 Duodenitis without bleeding: Secondary | ICD-10-CM | POA: Insufficient documentation

## 2023-09-03 DIAGNOSIS — D509 Iron deficiency anemia, unspecified: Secondary | ICD-10-CM | POA: Insufficient documentation

## 2023-09-03 DIAGNOSIS — K31A19 Gastric intestinal metaplasia without dysplasia, unspecified site: Secondary | ICD-10-CM | POA: Diagnosis not present

## 2023-09-03 HISTORY — PX: BIOPSY: SHX5522

## 2023-09-03 HISTORY — PX: ESOPHAGOGASTRODUODENOSCOPY (EGD) WITH PROPOFOL: SHX5813

## 2023-09-03 SURGERY — ESOPHAGOGASTRODUODENOSCOPY (EGD) WITH PROPOFOL
Anesthesia: General

## 2023-09-03 MED ORDER — PROPOFOL 10 MG/ML IV BOLUS
INTRAVENOUS | Status: DC | PRN
  Administered 2023-09-03: 125 ug/kg/min via INTRAVENOUS
  Administered 2023-09-03: 30 mg via INTRAVENOUS
  Administered 2023-09-03: 40 mg via INTRAVENOUS
  Administered 2023-09-03 (×2): 20 mg via INTRAVENOUS
  Administered 2023-09-03: 60 mg via INTRAVENOUS

## 2023-09-03 MED ORDER — LACTATED RINGERS IV SOLN: INTRAVENOUS | Status: DC

## 2023-09-03 MED ORDER — PROPOFOL 500 MG/50ML IV EMUL
INTRAVENOUS | Status: AC
  Filled 2023-09-03: qty 50

## 2023-09-03 MED ORDER — LIDOCAINE HCL (CARDIAC) PF 100 MG/5ML IV SOSY
PREFILLED_SYRINGE | INTRAVENOUS | Status: DC | PRN
  Administered 2023-09-03: 80 mg via INTRATRACHEAL

## 2023-09-03 NOTE — Transfer of Care (Signed)
Immediate Anesthesia Transfer of Care Note  Patient: Jaclyn Thornton  Procedure(s) Performed: ESOPHAGOGASTRODUODENOSCOPY (EGD) WITH PROPOFOL BIOPSY  Patient Location: PACU and Endoscopy Unit  Anesthesia Type:MAC  Level of Consciousness: awake and alert   Airway & Oxygen Therapy: Patient Spontanous Breathing  Post-op Assessment: Report given to RN and Post -op Vital signs reviewed and stable  Post vital signs: Reviewed and stable  Last Vitals:  Vitals Value Taken Time  BP 116/77 09/03/23 1503  Temp 36.9 C 09/03/23 1503  Pulse 76 09/03/23 1503  Resp 18 09/03/23 1503  SpO2 100 % 09/03/23 1503    Last Pain:  Vitals:   09/03/23 1503  TempSrc: Oral  PainSc: 0-No pain         Complications: No notable events documented.

## 2023-09-03 NOTE — Discharge Instructions (Addendum)
EGD Discharge instructions Please read the instructions outlined below and refer to this sheet in the next few weeks. These discharge instructions provide you with general information on caring for yourself after you leave the hospital. Your doctor may also give you specific instructions. While your treatment has been planned according to the most current medical practices available, unavoidable complications occasionally occur. If you have any problems or questions after discharge, please call your doctor. ACTIVITY You may resume your regular activity but move at a slower pace for the next 24 hours.  Take frequent rest periods for the next 24 hours.  Walking will help expel (get rid of) the air and reduce the bloated feeling in your abdomen.  No driving for 24 hours (because of the anesthesia (medicine) used during the test).  You may shower.  Do not sign any important legal documents or operate any machinery for 24 hours (because of the anesthesia used during the test).  NUTRITION Drink plenty of fluids.  You may resume your normal diet.  Begin with a light meal and progress to your normal diet.  Avoid alcoholic beverages for 24 hours or as instructed by your caregiver.  MEDICATIONS You may resume your normal medications unless your caregiver tells you otherwise.  WHAT YOU CAN EXPECT TODAY You may experience abdominal discomfort such as a feeling of fullness or "gas" pains.  FOLLOW-UP Your doctor will discuss the results of your test with you.  SEEK IMMEDIATE MEDICAL ATTENTION IF ANY OF THE FOLLOWING OCCUR: Excessive nausea (feeling sick to your stomach) and/or vomiting.  Severe abdominal pain and distention (swelling).  Trouble swallowing.  Temperature over 101 F (37.8 C).  Rectal bleeding or vomiting of blood.   Your EGD again revealed a tightening/stricture of your small bowel.  I was able to pass the endoscope through this today which dilated the area.  I was not able to do this  prior.  Previously noted ulcer is healed.  I did take biopsies, we will call with results.  Esophagus appeared normal.  Mild gastritis again noted.  Continue on pantoprazole twice daily.  Avoid NSAIDs.  You can stop your sucralfate or just take as needed for breakthrough symptoms.  Follow up in GI office in 2 months  I hope you have a great rest of your week!  Hennie Duos. Marletta Lor, D.O. Gastroenterology and Hepatology [facility]

## 2023-09-03 NOTE — Interval H&P Note (Signed)
History and Physical Interval Note:  09/03/2023 2:37 PM  Jaclyn Thornton  has presented today for surgery, with the diagnosis of duodunal stricture.  The various methods of treatment have been discussed with the patient and family. After consideration of risks, benefits and other options for treatment, the patient has consented to  Procedure(s) with comments: ESOPHAGOGASTRODUODENOSCOPY (EGD) WITH PROPOFOL (N/A) - 2:30 pm, asa 3 BALLOON DILATION (N/A) as a surgical intervention.  The patient's history has been reviewed, patient examined, no change in status, stable for surgery.  I have reviewed the patient's chart and labs.  Questions were answered to the patient's satisfaction.     Lanelle Bal

## 2023-09-03 NOTE — Anesthesia Preprocedure Evaluation (Signed)
Anesthesia Evaluation  Patient identified by MRN, date of birth, ID band Patient awake    Reviewed: Allergy & Precautions, H&P , NPO status , Patient's Chart, lab work & pertinent test results, reviewed documented beta blocker date and time   Airway Mallampati: II  TM Distance: >3 FB Neck ROM: full    Dental no notable dental hx.    Pulmonary neg pulmonary ROS   Pulmonary exam normal breath sounds clear to auscultation       Cardiovascular Exercise Tolerance: Good hypertension, negative cardio ROS  Rhythm:regular Rate:Normal     Neuro/Psych  Headaches negative neurological ROS  negative psych ROS   GI/Hepatic negative GI ROS, Neg liver ROS, PUD,GERD  ,,  Endo/Other  negative endocrine ROS    Renal/GU negative Renal ROS  negative genitourinary   Musculoskeletal   Abdominal   Peds  Hematology negative hematology ROS (+) Blood dyscrasia, anemia   Anesthesia Other Findings   Reproductive/Obstetrics negative OB ROS                             Anesthesia Physical Anesthesia Plan  ASA: 2  Anesthesia Plan: General   Post-op Pain Management:    Induction:   PONV Risk Score and Plan: Propofol infusion  Airway Management Planned:   Additional Equipment:   Intra-op Plan:   Post-operative Plan:   Informed Consent: I have reviewed the patients History and Physical, chart, labs and discussed the procedure including the risks, benefits and alternatives for the proposed anesthesia with the patient or authorized representative who has indicated his/her understanding and acceptance.     Dental Advisory Given  Plan Discussed with: CRNA  Anesthesia Plan Comments:        Anesthesia Quick Evaluation

## 2023-09-03 NOTE — Op Note (Signed)
[facility] Patient Name: Jaclyn Thornton Procedure Date: 09/03/2023 2:31 PM MRN: [id number] Date of Birth: [birth year] Attending MD: Hennie Duos. Maple Mirza, [id number] CSN: [id number] Age: 43 Admit Type: Outpatient Procedure:                Upper GI endoscopy Indications:              Iron deficiency anemia, Follow-up of peptic ulcer Providers:                Hennie Duos. Marletta Lor, DO, Angelica Ran, Dyann Ruddle Referring MD:              Medicines:                See the Anesthesia note for documentation of the                            administered medications Complications:            No immediate complications. Estimated Blood Loss:     Estimated blood loss was minimal. Procedure:                Pre-Anesthesia Assessment:                           - The anesthesia plan was to use monitored                            anesthesia care (MAC).                           After obtaining informed consent, the endoscope was                            passed under direct vision. Throughout the                            procedure, the patient's blood pressure, pulse, and                            oxygen saturations were monitored continuously. The                            GIF-H190 ([id number]) scope was introduced through the                            mouth, and advanced to the second part of duodenum.                            The upper GI endoscopy was accomplished without                            difficulty. The patient tolerated the procedure                            well. Scope In: 2:51:24 PM Scope Out: 2:57:26 PM Total Procedure Duration: 0 hours 6 minutes 2 seconds  Findings:      The Z-line was regular  and was found 36 cm from the incisors.      Patchy mild inflammation characterized by erythema was found in the       gastric body and in the gastric antrum.      An acquired benign-appearing, intrinsic moderate stenosis was found in       the duodenal sweep and was traversed.  Biopsies were taken with a cold       forceps for histology. Mild muscosal disruption occured with passage of       endoscope itself. Previously noted duodenal ulcer has healed. Stricture       dilated today with passage of endoscope. Further dilation not performed. Impression:               - Z-line regular, 36 cm from the incisors.                           - Gastritis.                           - Acquired duodenal stenosis. Biopsied. Moderate Sedation:      Per Anesthesia Care Recommendation:           - Patient has a contact number available for                            emergencies. The signs and symptoms of potential                            delayed complications were discussed with the                            patient. Return to normal activities tomorrow.                            Written discharge instructions were provided to the                            patient.                           - Resume previous diet.                           - Continue present medications.                           - Await pathology results.                           - Repeat upper endoscopy PRN for retreatment.                           - Return to GI clinic in 2 months.                           - Use a proton pump inhibitor PO BID.                           -  No aspirin, ibuprofen, naproxen, or other                            non-steroidal anti-inflammatory drugs. Procedure Code(s):        --- Professional ---                           [id number], Esophagogastroduodenoscopy, flexible,                            transoral; with biopsy, single or multiple Diagnosis Code(s):        --- Professional ---                           K29.70, Gastritis, unspecified, without bleeding                           K31.5, Obstruction of duodenum                           D50.9, Iron deficiency anemia, unspecified                           K27.9, Peptic ulcer, site unspecified, unspecified                             as acute or chronic, without hemorrhage or                            perforation CPT copyright 2022 American Medical Association. All rights reserved. The codes documented in this report are preliminary and upon coder review may  be revised to meet current compliance requirements. Hennie Duos. Marletta Lor, DO Hennie Duos. Marletta Lor, DO 09/03/2023 3:03:32 PM This report has been signed electronically. Number of Addenda: 0

## 2023-09-04 ENCOUNTER — Encounter (HOSPITAL_COMMUNITY): Payer: Self-pay | Admitting: Internal Medicine

## 2023-09-05 LAB — SURGICAL PATHOLOGY

## 2023-09-07 NOTE — Anesthesia Postprocedure Evaluation (Signed)
Anesthesia Post Note  Patient: Jaclyn Thornton  Procedure(s) Performed: ESOPHAGOGASTRODUODENOSCOPY (EGD) WITH PROPOFOL BIOPSY  Patient location during evaluation: Phase II Anesthesia Type: General Level of consciousness: awake Pain management: pain level controlled Vital Signs Assessment: post-procedure vital signs reviewed and stable Respiratory status: spontaneous breathing and respiratory function stable Cardiovascular status: blood pressure returned to baseline and stable Postop Assessment: no headache and no apparent nausea or vomiting Anesthetic complications: no Comments: Late entry   No notable events documented.   Last Vitals:  Vitals:   09/03/23 1257 09/03/23 1503  BP: 123/68 116/77  Pulse:  76  Resp:  18  Temp:  36.9 C  SpO2:  100%    Last Pain:  Vitals:   09/04/23 1105  TempSrc:   PainSc: 0-No pain                 Windell Norfolk

## 2023-09-19 ENCOUNTER — Other Ambulatory Visit: Payer: Self-pay | Admitting: Obstetrics & Gynecology

## 2023-09-19 ENCOUNTER — Encounter: Payer: Self-pay | Admitting: Advanced Practice Midwife

## 2023-09-19 MED ORDER — FLUCONAZOLE 150 MG PO TABS: ORAL_TABLET | ORAL | 1 refills | Status: DC

## 2023-09-19 NOTE — Progress Notes (Signed)
Rx for yeast. 

## 2023-10-31 ENCOUNTER — Encounter: Payer: Self-pay | Admitting: Internal Medicine

## 2024-04-21 ENCOUNTER — Encounter: Payer: Self-pay | Admitting: Advanced Practice Midwife

## 2024-04-24 ENCOUNTER — Encounter: Payer: Self-pay | Admitting: Advanced Practice Midwife

## 2024-04-24 ENCOUNTER — Ambulatory Visit (INDEPENDENT_AMBULATORY_CARE_PROVIDER_SITE_OTHER): Admitting: Advanced Practice Midwife

## 2024-04-24 ENCOUNTER — Other Ambulatory Visit (HOSPITAL_COMMUNITY)
Admission: RE | Admit: 2024-04-24 | Discharge: 2024-04-24 | Disposition: A | Source: Ambulatory Visit | Attending: Advanced Practice Midwife | Admitting: Advanced Practice Midwife

## 2024-04-24 VITALS — BP 126/85 | HR 76 | Ht 66.0 in | Wt 203.0 lb

## 2024-04-24 DIAGNOSIS — Z13220 Encounter for screening for lipoid disorders: Secondary | ICD-10-CM | POA: Diagnosis not present

## 2024-04-24 DIAGNOSIS — N9489 Other specified conditions associated with female genital organs and menstrual cycle: Secondary | ICD-10-CM | POA: Insufficient documentation

## 2024-04-24 DIAGNOSIS — Z113 Encounter for screening for infections with a predominantly sexual mode of transmission: Secondary | ICD-10-CM | POA: Insufficient documentation

## 2024-04-24 DIAGNOSIS — Z1329 Encounter for screening for other suspected endocrine disorder: Secondary | ICD-10-CM

## 2024-04-24 DIAGNOSIS — Z131 Encounter for screening for diabetes mellitus: Secondary | ICD-10-CM | POA: Diagnosis not present

## 2024-04-24 DIAGNOSIS — I10 Essential (primary) hypertension: Secondary | ICD-10-CM | POA: Diagnosis not present

## 2024-04-24 DIAGNOSIS — Z01419 Encounter for gynecological examination (general) (routine) without abnormal findings: Secondary | ICD-10-CM | POA: Diagnosis not present

## 2024-04-24 MED ORDER — FLUCONAZOLE 150 MG PO TABS: ORAL_TABLET | ORAL | 1 refills | Status: AC

## 2024-04-24 MED ORDER — AMLODIPINE BESYLATE 5 MG PO TABS: 5.0000 mg | ORAL_TABLET | Freq: Every day | ORAL | 4 refills | Status: DC

## 2024-04-24 NOTE — Addendum Note (Signed)
 Addended by: ILEAN RUTHERFORD HERO on: 04/24/2024 12:38 PM   Modules accepted: Orders

## 2024-04-24 NOTE — Progress Notes (Signed)
 Jaclyn Thornton 43 y.o.  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  Repeat EGD in 8-12 weeks to evaluate response to therapy and consider dilation of stricture.   ESOPHAGOGASTRODUODENOSCOPY (EGD) WITH PROPOFOL  N/A 09/03/2023   Procedure: ESOPHAGOGASTRODUODENOSCOPY (EGD) WITH PROPOFOL ;  Surgeon: Cindie Carlin POUR, DO;  Location: [facility];  Service: Endoscopy;  Laterality: N/A;  2:30 pm, asa 3   LAPAROSCOPIC BILATERAL SALPINGECTOMY Bilateral 07/23/2015   Procedure: LAPAROSCOPIC BILATERAL SALPINGECTOMY;  Surgeon: Norleen Edsel GAILS, MD;  Location: [facility];  Service: Gynecology;  Laterality: Bilateral;   TUBAL LIGATION  2001    Family History: Family History  Problem Relation Age of Onset   Diabetes Mother    Hypertension Mother    Diabetes Father    Hypertension Father    Diabetes Brother    Dementia Paternal Grandmother    Breast cancer Maternal Aunt    Ovarian cancer Maternal  Aunt    Breast cancer Paternal Aunt    Breast cancer Paternal Aunt    Colon cancer Neg Hx     Social History: Social History   Tobacco Use   Smoking status: Never   Smokeless tobacco: Never  Vaping Use   Vaping status: Never Used  Substance Use Topics   Alcohol use: Yes    Alcohol/week: 0.0 standard drinks of alcohol    Comment: occ   Drug use: No    Allergies:  Allergies  Allergen Reactions   Hydrocodone Nausea And Vomiting      Current Outpatient Medications:    acetaminophen  (TYLENOL ) 500 MG tablet, Take 500 mg by mouth every 6 (six) hours as needed for headache. , Disp: , Rfl:    amLODipine  (NORVASC ) 5 MG tablet, Take 1 tablet (5 mg total) by mouth daily., Disp: 90 tablet, Rfl: 4   Dihydroxyaluminum Sod Carb (ROLAIDS PO), Take 1 tablet by mouth as needed (heartburn)., Disp: , Rfl:    diphenhydrAMINE HCl (BENADRYL PO), Take 1 tablet by mouth daily., Disp: , Rfl:    ferrous sulfate 325 (65 FE) MG EC tablet, Take 325 mg by mouth daily with breakfast., Disp: , Rfl:    fluconazole  (DIFLUCAN ) 150 MG tablet, Take one tablet then repeat in 3 days if needed, Disp: 2 tablet, Rfl: 1   pantoprazole  (PROTONIX ) 40 MG tablet, Take 1 tablet (40 mg total) by mouth 2 (two) times daily. For stomach Ulcer, Disp: 60 tablet,  Rfl: 3   sucralfate  (CARAFATE ) 1 g tablet, TAKE 1 TABLET(1 GRAM) BY MOUTH FOUR TIMES DAILY, Disp: 120 tablet, Rfl: 0   SUMAtriptan  (IMITREX ) 100 MG tablet, Take 1 tablet (100 mg total) by mouth once as needed for migraine or headache. May repeat in 2 hours if headache persists or recurs., Disp: 30 tablet, Rfl: 2      04/24/2024   10:34 AM 04/05/2023   11:04 AM 03/22/2022    1:40 PM 03/09/2021    8:46 AM  Depression screen PHQ 2/9  Decreased Interest 0 0 0 0  Down, Depressed, Hopeless 0 0 1 1  PHQ - 2 Score 0 0 1 1  Altered sleeping 1 1 1 1   Tired, decreased energy 1 0 0 1  Change in appetite 0 0 0 0  Feeling bad or failure about yourself  0 0 0 0  Trouble  concentrating 0 0 0 0  Moving slowly or fidgety/restless 0 0 0 0  Suicidal thoughts 0 0 0 0  PHQ-9 Score 2 1 2 3         04/24/2024   10:34 AM 04/05/2023   11:04 AM 03/22/2022    1:46 PM 03/09/2021    8:48 AM  GAD 7 : Generalized Anxiety Score  Nervous, Anxious, on Edge 0 0 0 0  Control/stop worrying 0 0 0 0  Worry too much - different things 0 0 1 1  Trouble relaxing 0 0 1 0  Restless 0 0 0 0  Easily annoyed or irritable 1 0 0 1  Afraid - awful might happen 0 0 0 0  Total GAD 7 Score 1 0 2 2      Mental Health score normal Yes Follow up referred to [place] No   Upstream - 04/24/24 1043       Pregnancy Intention Screening   Does the patient want to become pregnant in the next year? No    Does the patient's partner want to become pregnant in the next year? No    Would the patient like to discuss contraceptive options today? No      Contraception Wrap Up   Current Method Female Sterilization    End Method Female Sterilization    Contraception Counseling Provided No          The pregnancy intention screening data noted above was reviewed. Potential methods of contraception were discussed. The patient elected to proceed with Female Sterilization.   History of Present Illness: Here for physical.   felt like she had a yeast infection, took a dlflucan 2 ago, still has some burning.  Had GI bleed last year. Still has menstrual migraines, can't take anything but [facility] d/t GI bleed/immitrex causes muscle aches. HAs relieved mainly w/r  Prior cytology:      Component Value Date/Time   DIAGPAP  03/22/2022 1344    - Negative for intraepithelial lesion or malignancy (NILM)   DIAGPAP  02/06/2019 0000    NEGATIVE FOR INTRAEPITHELIAL LESIONS OR MALIGNANCY.   HPVHIGH Negative 03/22/2022 1344   ADEQPAP  03/22/2022 1344    Satisfactory for evaluation; transformation zone component PRESENT.   ADEQPAP  02/06/2019 0000    Satisfactory for evaluation  endocervical/transformation zone  component PRESENT.      Review of Systems   Patient denies any, blurred vision, shortness of breath, chest pain, abdominal pain, problems with bowel movements, urination, or intercourse. est.    Physical Exam: General:  Well developed, well nourished, no acute distress Skin:  Warm and dry Neck:  Midline trachea Lungs; Clear to auscultation bilaterally Breast:  No dominant palpable mass, retraction, or nipple discharge Cardiovascular: Regular rate and rhythm Abdomen:  Soft, non tender, no hepatosplenomegaly Pelvic:  External genitalia is normal in appearance.  The vagina iis sl red, DC appears normal w/o odor.  The cervix is bulbous.  Uterus is felt to be normal size, shape, and contour.  No adnexal masses or tenderness noted. Exam limited by habitus  Extremities:  No swelling or varicosities noted  Chaperone: Rutherford Rover  Impression: Normal GYN exam Pap due next year Mammogram due_--# given to schedule PCP list given Orders Placed This Encounter  Procedures   CBC   Comprehensive metabolic panel with GFR    Has the patient fasted?:   Yes   Hemoglobin A1c   Lipid panel    Has the patient fasted?:   Yes   TSH

## 2024-04-24 NOTE — Patient Instructions (Addendum)
 [place] [facility] [place] [facility]:       [employer] [phone]                [facility] [phone] (usually not accepting new patients unless you have family there already, you are always welcome to call and ask)      [facility] Department [phone]   [facility]:  [phone] [facility]:  [phone]     [facility] Doctors:  [facility]: [phone] [facility]: [phone]  [facility] Doctors:  [facility]: [phone]  [facility]: [phone]  [facility] Doctors: [facility]: [phone]    Call [phone] for mammogram

## 2024-04-25 LAB — CERVICOVAGINAL ANCILLARY ONLY
Bacterial Vaginitis (gardnerella): NEGATIVE
Candida Glabrata: NEGATIVE
Candida Vaginitis: POSITIVE — AB
Chlamydia: NEGATIVE
Comment: NEGATIVE
Comment: NEGATIVE
Comment: NEGATIVE
Comment: NEGATIVE
Comment: NEGATIVE
Comment: NORMAL
Neisseria Gonorrhea: NEGATIVE
Trichomonas: NEGATIVE

## 2024-04-25 LAB — COMPREHENSIVE METABOLIC PANEL WITH GFR
ALT: 33 IU/L — ABNORMAL HIGH (ref 0–32)
AST: 27 IU/L (ref 0–40)
Albumin: 4.7 g/dL (ref 3.9–4.9)
Alkaline Phosphatase: 90 IU/L (ref 41–116)
BUN/Creatinine Ratio: 14 (ref 9–23)
BUN: 9 mg/dL (ref 6–24)
Bilirubin Total: 0.7 mg/dL (ref 0.0–1.2)
CO2: 21 mmol/L (ref 20–29)
Calcium: 9.7 mg/dL (ref 8.7–10.2)
Chloride: 102 mmol/L (ref 96–106)
Creatinine, Ser: 0.64 mg/dL (ref 0.57–1.00)
Globulin, Total: 2.9 g/dL (ref 1.5–4.5)
Glucose: 97 mg/dL (ref 70–99)
Potassium: 4.1 mmol/L (ref 3.5–5.2)
Sodium: 138 mmol/L (ref 134–144)
Total Protein: 7.6 g/dL (ref 6.0–8.5)
eGFR: 112 mL/min/1.73 (ref >59)

## 2024-04-25 LAB — LIPID PANEL
Chol/HDL Ratio: 3.8 ratio (ref 0.0–4.4)
Cholesterol, Total: 270 mg/dL — ABNORMAL HIGH (ref 100–199)
HDL: 72 mg/dL (ref >39)
LDL Chol Calc (NIH): 170 mg/dL — ABNORMAL HIGH (ref 0–99)
Triglycerides: 154 mg/dL — ABNORMAL HIGH (ref 0–149)
VLDL Cholesterol Cal: 28 mg/dL (ref 5–40)

## 2024-04-25 LAB — CBC
Hematocrit: 38.7 % (ref 34.0–46.6)
Hemoglobin: 12.6 g/dL (ref 11.1–15.9)
MCH: 29.6 pg (ref 26.6–33.0)
MCHC: 32.6 g/dL (ref 31.5–35.7)
MCV: 91 fL (ref 79–97)
Platelets: 322 x10E3/uL (ref 150–450)
RBC: 4.26 x10E6/uL (ref 3.77–5.28)
RDW: 13.6 % (ref 11.7–15.4)
WBC: 6.3 x10E3/uL (ref 3.4–10.8)

## 2024-04-25 LAB — TSH: TSH: 1.62 u[IU]/mL (ref 0.450–4.500)

## 2024-04-25 LAB — HEMOGLOBIN A1C
Est. average glucose Bld gHb Est-mCnc: 117 mg/dL
Hgb A1c MFr Bld: 5.7 % — ABNORMAL HIGH (ref 4.8–5.6)

## 2024-04-28 ENCOUNTER — Ambulatory Visit: Payer: Self-pay | Admitting: Advanced Practice Midwife

## 2024-04-28 ENCOUNTER — Encounter: Payer: Self-pay | Admitting: Advanced Practice Midwife

## 2024-04-28 DIAGNOSIS — R7303 Prediabetes: Secondary | ICD-10-CM | POA: Insufficient documentation

## 2024-04-28 DIAGNOSIS — E7800 Pure hypercholesterolemia, unspecified: Secondary | ICD-10-CM | POA: Insufficient documentation

## 2024-05-06 ENCOUNTER — Other Ambulatory Visit (HOSPITAL_COMMUNITY): Payer: Self-pay | Admitting: Advanced Practice Midwife

## 2024-05-06 DIAGNOSIS — Z1231 Encounter for screening mammogram for malignant neoplasm of breast: Secondary | ICD-10-CM

## 2024-05-07 ENCOUNTER — Ambulatory Visit (HOSPITAL_COMMUNITY)
Admission: RE | Admit: 2024-05-07 | Discharge: 2024-05-07 | Disposition: A | Discharge status: Home / Self Care | Source: Ambulatory Visit | Attending: Advanced Practice Midwife | Admitting: Advanced Practice Midwife

## 2024-05-07 ENCOUNTER — Encounter (HOSPITAL_COMMUNITY): Payer: Self-pay

## 2024-05-07 DIAGNOSIS — Z1231 Encounter for screening mammogram for malignant neoplasm of breast: Secondary | ICD-10-CM | POA: Insufficient documentation

## 2024-06-30 ENCOUNTER — Other Ambulatory Visit: Payer: Self-pay | Admitting: Advanced Practice Midwife

## 2024-07-22 ENCOUNTER — Other Ambulatory Visit: Payer: Self-pay

## 2024-07-22 ENCOUNTER — Emergency Department (HOSPITAL_COMMUNITY)
Admission: EM | Admit: 2024-07-22 | Discharge: 2024-07-22 | Disposition: A | Discharge status: Home / Self Care | Attending: Emergency Medicine | Admitting: Emergency Medicine

## 2024-07-22 ENCOUNTER — Encounter (HOSPITAL_COMMUNITY): Payer: Self-pay | Admitting: Emergency Medicine

## 2024-07-22 DIAGNOSIS — I10 Essential (primary) hypertension: Secondary | ICD-10-CM | POA: Diagnosis not present

## 2024-07-22 DIAGNOSIS — N3001 Acute cystitis with hematuria: Secondary | ICD-10-CM | POA: Diagnosis not present

## 2024-07-22 DIAGNOSIS — R319 Hematuria, unspecified: Secondary | ICD-10-CM | POA: Diagnosis present

## 2024-07-22 DIAGNOSIS — N3091 Cystitis, unspecified with hematuria: Secondary | ICD-10-CM

## 2024-07-22 LAB — URINALYSIS, ROUTINE W REFLEX MICROSCOPIC
Bilirubin Urine: NEGATIVE
Glucose, UA: NEGATIVE mg/dL
Ketones, ur: NEGATIVE mg/dL
Nitrite: NEGATIVE
Protein, ur: 30 mg/dL — AB
RBC / HPF: >50 RBC/hpf (ref 0–5)
Specific Gravity, Urine: 1.01 (ref 1.005–1.030)
WBC, UA: >50 WBC/hpf (ref 0–5)
pH: 6 (ref 5.0–8.0)

## 2024-07-22 MED ORDER — NITROFURANTOIN MONOHYD MACRO 100 MG PO CAPS
100.0000 mg | ORAL_CAPSULE | Freq: Two times a day (BID) | ORAL | Status: DC
  Administered 2024-07-22: 100 mg via ORAL
  Filled 2024-07-22: qty 1

## 2024-07-22 MED ORDER — PHENAZOPYRIDINE HCL 200 MG PO TABS: 200.0000 mg | ORAL_TABLET | Freq: Three times a day (TID) | ORAL | 0 refills | Status: AC

## 2024-07-22 MED ORDER — NITROFURANTOIN MONOHYD MACRO 100 MG PO CAPS: 100.0000 mg | ORAL_CAPSULE | Freq: Two times a day (BID) | ORAL | 0 refills | Status: AC

## 2024-07-22 NOTE — Discharge Instructions (Addendum)
 You were seen in the emergency room today for pain with urination and blood in your urine.  Urinalysis are consistent with a UTI.  We are treating with antibiotics.  We also prescribing Pyridium , this is a medication that will help with your discomfort, please be advised it will cause urine to turn orange.  Follow-up with your primary care doctor, come back to the ER for new or worsening symptoms.  Make sure you drink plenty of water.

## 2024-07-22 NOTE — ED Triage Notes (Signed)
 Pt c/o of painful urination and hematuria x2 days.

## 2024-07-22 NOTE — ED Provider Notes (Signed)
 " [place] [facility] AT [facility] Provider Note   CSN: [id number] Arrival date & time: 07/22/24  1019     Patient presents with: Hematuria   Jaclyn Thornton is a 44 y.o. female.  Has history of hypertension, hypercholesterolemia and GERD.  Presents ER today with dysuria, frequency and hematuria since this morning, denies fever or chills, denies back pain, denies abdominal pain, no nausea or vomiting.  No vaginal symptoms.  No new sexual partners.  Never had this in the past.    Hematuria       Prior to Admission medications  Medication Sig Start Date End Date Taking? Authorizing Provider  nitrofurantoin , macrocrystal-monohydrate, (MACROBID ) 100 MG capsule Take 1 capsule (100 mg total) by mouth 2 (two) times daily. 07/22/24  Yes Stephanieann Popescu A, PA-C  acetaminophen  (TYLENOL ) 500 MG tablet Take 500 mg by mouth every 6 (six) hours as needed for headache.     [provider]  amLODipine  (NORVASC ) 5 MG tablet TAKE 1 TABLET(5 MG) BY MOUTH DAILY 07/03/24   Cresenzo-Dishmon, Cathlean, CNM  Dihydroxyaluminum Sod Carb (ROLAIDS PO) Take 1 tablet by mouth as needed (heartburn).    [provider]  diphenhydrAMINE HCl (BENADRYL PO) Take 1 tablet by mouth daily.    [provider]  ferrous sulfate 325 (65 FE) MG EC tablet Take 325 mg by mouth daily with breakfast.    [provider]  fluconazole  (DIFLUCAN ) 150 MG tablet Take one tablet then repeat in 3 days if needed 04/24/24   Cresenzo-Dishmon, Cathlean, CNM  pantoprazole  (PROTONIX ) 40 MG tablet Take 1 tablet (40 mg total) by mouth 2 (two) times daily. For stomach Ulcer 05/30/23 05/29/24  Pearlean Manus, MD  phenazopyridine  (PYRIDIUM ) 200 MG tablet Take 1 tablet (200 mg total) by mouth 3 (three) times daily. 07/22/24  Yes Yarethzi Branan A, PA-C  sucralfate  (CARAFATE ) 1 g tablet TAKE 1 TABLET(1 GRAM) BY MOUTH FOUR TIMES DAILY 08/07/23   Rudy Josette RAMAN, PA-C  SUMAtriptan  (IMITREX ) 100 MG  tablet Take 1 tablet (100 mg total) by mouth once as needed for migraine or headache. May repeat in 2 hours if headache persists or recurs. 05/30/23 05/30/24  Pearlean Manus, MD    Allergies: Hydrocodone    Review of Systems  Genitourinary:  Positive for hematuria.    Updated Vital Signs BP (!) 131/101 (BP Location: Right Arm)   Pulse 78   Temp 98.6 F (37 C)   Resp 16   Ht 5' 6 (1.676 m)   Wt 92 kg   SpO2 99%   BMI 32.74 kg/m   Physical Exam Vitals and nursing note reviewed.  Constitutional:      General: She is not in acute distress.    Appearance: She is well-developed.  HENT:     Head: Normocephalic and atraumatic.     Mouth/Throat:     Mouth: Mucous membranes are moist.  Eyes:     Conjunctiva/sclera: Conjunctivae normal.  Cardiovascular:     Rate and Rhythm: Normal rate and regular rhythm.     Heart sounds: No murmur heard. Pulmonary:     Effort: Pulmonary effort is normal. No respiratory distress.     Breath sounds: Normal breath sounds.  Abdominal:     Palpations: Abdomen is soft.     Tenderness: There is no abdominal tenderness.  Musculoskeletal:        General: No swelling.     Cervical back: Neck supple.  Skin:    General: Skin  is warm and dry.     Capillary Refill: Capillary refill takes less than 2 seconds.  Neurological:     General: No focal deficit present.     Mental Status: She is alert and oriented to person, place, and time.  Psychiatric:        Mood and Affect: Mood normal.     (all labs ordered are listed, but only abnormal results are displayed) Labs Reviewed  URINALYSIS, ROUTINE W REFLEX MICROSCOPIC - Abnormal; Notable for the following components:      Result Value   APPearance HAZY (*)    Hgb urine dipstick LARGE (*)    Protein, ur 30 (*)    Leukocytes,Ua LARGE (*)    Bacteria, UA RARE (*)    All other components within normal limits  URINE CULTURE    EKG: None  Radiology: No results found.   Procedures    Medications Ordered in the ED  nitrofurantoin  (macrocrystal-monohydrate) (MACROBID ) capsule 100 mg (has no administration in time range)                                    Medical Decision Making Differential diagnosis includes but not limited to UTI, STI, pyelonephritis, ureterolithiasis, other  ED course: Patient presents ER today for evaluation of dysuria and hematuria since this morning.  She denies fever or chills, she has no abdominal pain or tenderness, no CVA tenderness or back pain she is not having systemic symptoms.  She is not tachycardic or febrile.  UA was ordered and showed large leukocytes, greater than 50 red blood cells per high-power field, greater than 50 white blood cells per high-power field and rare bacteria with white blood cell clumps.  This is consistent with UTI given her symptoms.  Will treat with 5 days of Macrobid  as there is no concern for ascending urinary tract infection.  Urine was sent for culture.  Patient given Pyridium  for symptomatic treatment, advised on strict return precautions and follow-up with PCP.  Considered further workup including labs and imaging such as CT for possible kidney stone, given patient's history and exam I have very low suspicion and do not feel she warrants further workup.  She does not meet any admission criteria.   Amount and/or Complexity of Data Reviewed External Data Reviewed: labs and notes. Labs: ordered. Decision-making details documented in ED Course.  Risk Prescription drug management.        Final diagnoses:  Hemorrhagic cystitis    ED Discharge Orders          Ordered    phenazopyridine  (PYRIDIUM ) 200 MG tablet  3 times daily        07/22/24 1215    nitrofurantoin , macrocrystal-monohydrate, (MACROBID ) 100 MG capsule  2 times daily        07/22/24 [address] 07/22/24 1222    Suzette Pac, MD 07/22/24 1738  "

## 2024-07-24 LAB — URINE CULTURE: Culture: >=100000 — AB

## 2024-07-25 ENCOUNTER — Encounter: Payer: Self-pay | Admitting: Advanced Practice Midwife

## 2024-07-25 ENCOUNTER — Telehealth (HOSPITAL_BASED_OUTPATIENT_CLINIC_OR_DEPARTMENT_OTHER): Payer: Self-pay

## 2024-07-25 NOTE — Telephone Encounter (Signed)
 Post ED Visit - Positive Culture Follow-up  Culture report reviewed by antimicrobial stewardship pharmacist: [facility] [x]  Elma Fail, Pharm.D. []  Venetia Gully, Pharm.D., BCPS AQ-ID []  Garrel Crews, Pharm.D., BCPS []  Almarie Lunger, [address].D., BCPS []  [place], [address].D., BCPS, AAHIVP []  Rosaline Bihari, Pharm.D., BCPS, AAHIVP []  Vernell Meier, PharmD, BCPS []  Latanya Hint, PharmD, BCPS []  Donald Medley, PharmD, BCPS []  Rocky Bold, PharmD []  Dorothyann Alert, PharmD, BCPS []  Morene Babe, PharmD  [facility] []  Rosaline Edison, PharmD []  Romona Bliss, PharmD []  Dolphus Roller, PharmD []  Veva Seip, Rph []  Vernell Daunt) Leonce, PharmD []  Eva Allis, PharmD []  Rosaline Millet, PharmD []  Iantha Batch, PharmD []  Arvin Gauss, PharmD []  Wanda Hasting, PharmD []  Ronal Rav, PharmD []  Rocky Slade, PharmD []  Bard Jeans, PharmD   Positive urine culture Treated with Nitrofurantoin , organism sensitive to the same and no further patient follow-up is required at this time.  Ruth Camelia Elbe 07/25/2024, 9:10 AM
